# Patient Record
Sex: Male | Born: 1935 | Race: White | Hispanic: No | Marital: Married | Smoking: Never smoker
Health system: Southern US, Community
[De-identification: ages and names within clinical notes are randomized; demographics above are authoritative.]

## PROBLEM LIST (undated history)

## (undated) DIAGNOSIS — I4891 Unspecified atrial fibrillation: Secondary | ICD-10-CM

## (undated) DIAGNOSIS — N183 Chronic kidney disease, stage 3 unspecified: Secondary | ICD-10-CM

## (undated) DIAGNOSIS — E782 Mixed hyperlipidemia: Secondary | ICD-10-CM

## (undated) DIAGNOSIS — I6523 Occlusion and stenosis of bilateral carotid arteries: Secondary | ICD-10-CM

## (undated) DIAGNOSIS — I2510 Atherosclerotic heart disease of native coronary artery without angina pectoris: Secondary | ICD-10-CM

## (undated) DIAGNOSIS — R972 Elevated prostate specific antigen [PSA]: Secondary | ICD-10-CM

## (undated) DIAGNOSIS — N182 Chronic kidney disease, stage 2 (mild): Secondary | ICD-10-CM

## (undated) DIAGNOSIS — R0602 Shortness of breath: Secondary | ICD-10-CM

## (undated) DIAGNOSIS — I779 Disorder of arteries and arterioles, unspecified: Secondary | ICD-10-CM

## (undated) DIAGNOSIS — I509 Heart failure, unspecified: Secondary | ICD-10-CM

## (undated) DIAGNOSIS — I10 Essential (primary) hypertension: Secondary | ICD-10-CM

## (undated) HISTORY — DX: Elevated prostate specific antigen (PSA): R97.20

## (undated) HISTORY — DX: Shortness of breath: R06.02

## (undated) HISTORY — DX: Disorder of arteries and arterioles, unspecified: I77.9

## (undated) HISTORY — DX: Chronic kidney disease, stage 3 unspecified: N18.30

## (undated) HISTORY — DX: Mixed hyperlipidemia: E78.2

## (undated) HISTORY — DX: Chronic kidney disease, stage 3 (moderate): N18.3

## (undated) HISTORY — PX: JOINT REPLACEMENT: SHX530

## (undated) HISTORY — DX: Occlusion and stenosis of bilateral carotid arteries: I65.23

---

## 2005-07-31 ENCOUNTER — Inpatient Hospital Stay: Payer: Self-pay | Admitting: General Practice

## 2005-11-12 ENCOUNTER — Ambulatory Visit: Payer: Self-pay | Admitting: Internal Medicine

## 2005-11-21 ENCOUNTER — Ambulatory Visit: Payer: Self-pay | Admitting: Internal Medicine

## 2006-04-22 ENCOUNTER — Ambulatory Visit: Payer: Self-pay | Admitting: Unknown Physician Specialty

## 2008-07-11 ENCOUNTER — Ambulatory Visit: Payer: Self-pay | Admitting: Ophthalmology

## 2008-07-11 ENCOUNTER — Other Ambulatory Visit: Payer: Self-pay

## 2008-08-07 ENCOUNTER — Ambulatory Visit: Payer: Self-pay | Admitting: Ophthalmology

## 2009-04-19 ENCOUNTER — Ambulatory Visit: Payer: Self-pay | Admitting: Internal Medicine

## 2009-09-11 ENCOUNTER — Ambulatory Visit: Payer: Self-pay | Admitting: Unknown Physician Specialty

## 2011-04-14 ENCOUNTER — Emergency Department: Payer: Self-pay | Admitting: Emergency Medicine

## 2013-11-24 ENCOUNTER — Encounter (INDEPENDENT_AMBULATORY_CARE_PROVIDER_SITE_OTHER): Payer: Medicare Other | Admitting: Ophthalmology

## 2013-11-24 DIAGNOSIS — H43819 Vitreous degeneration, unspecified eye: Secondary | ICD-10-CM

## 2013-11-24 DIAGNOSIS — H35039 Hypertensive retinopathy, unspecified eye: Secondary | ICD-10-CM

## 2013-11-24 DIAGNOSIS — D3130 Benign neoplasm of unspecified choroid: Secondary | ICD-10-CM

## 2013-11-24 DIAGNOSIS — H33309 Unspecified retinal break, unspecified eye: Secondary | ICD-10-CM

## 2013-11-24 DIAGNOSIS — I10 Essential (primary) hypertension: Secondary | ICD-10-CM

## 2013-12-07 ENCOUNTER — Ambulatory Visit (INDEPENDENT_AMBULATORY_CARE_PROVIDER_SITE_OTHER): Payer: Medicare Other | Admitting: Ophthalmology

## 2013-12-08 ENCOUNTER — Ambulatory Visit (INDEPENDENT_AMBULATORY_CARE_PROVIDER_SITE_OTHER): Payer: Medicare Other | Admitting: Ophthalmology

## 2013-12-08 DIAGNOSIS — H33309 Unspecified retinal break, unspecified eye: Secondary | ICD-10-CM

## 2014-04-12 ENCOUNTER — Ambulatory Visit (INDEPENDENT_AMBULATORY_CARE_PROVIDER_SITE_OTHER): Payer: Medicare Other | Admitting: Ophthalmology

## 2014-04-20 ENCOUNTER — Ambulatory Visit (INDEPENDENT_AMBULATORY_CARE_PROVIDER_SITE_OTHER): Payer: Medicare PPO | Admitting: Ophthalmology

## 2014-04-20 DIAGNOSIS — D3130 Benign neoplasm of unspecified choroid: Secondary | ICD-10-CM

## 2014-04-20 DIAGNOSIS — H43819 Vitreous degeneration, unspecified eye: Secondary | ICD-10-CM

## 2014-04-20 DIAGNOSIS — H35039 Hypertensive retinopathy, unspecified eye: Secondary | ICD-10-CM

## 2014-04-20 DIAGNOSIS — I10 Essential (primary) hypertension: Secondary | ICD-10-CM

## 2014-04-20 DIAGNOSIS — H33309 Unspecified retinal break, unspecified eye: Secondary | ICD-10-CM

## 2015-04-24 ENCOUNTER — Ambulatory Visit (INDEPENDENT_AMBULATORY_CARE_PROVIDER_SITE_OTHER): Payer: Commercial Managed Care - HMO | Admitting: Ophthalmology

## 2015-04-24 DIAGNOSIS — H33301 Unspecified retinal break, right eye: Secondary | ICD-10-CM | POA: Diagnosis not present

## 2015-04-24 DIAGNOSIS — D3132 Benign neoplasm of left choroid: Secondary | ICD-10-CM

## 2015-04-24 DIAGNOSIS — I10 Essential (primary) hypertension: Secondary | ICD-10-CM | POA: Diagnosis not present

## 2015-04-24 DIAGNOSIS — H43813 Vitreous degeneration, bilateral: Secondary | ICD-10-CM | POA: Diagnosis not present

## 2015-04-24 DIAGNOSIS — H35033 Hypertensive retinopathy, bilateral: Secondary | ICD-10-CM | POA: Diagnosis not present

## 2016-01-15 DIAGNOSIS — E782 Mixed hyperlipidemia: Secondary | ICD-10-CM | POA: Diagnosis not present

## 2016-01-15 DIAGNOSIS — I6523 Occlusion and stenosis of bilateral carotid arteries: Secondary | ICD-10-CM | POA: Diagnosis not present

## 2016-01-15 DIAGNOSIS — R42 Dizziness and giddiness: Secondary | ICD-10-CM | POA: Diagnosis not present

## 2016-01-15 DIAGNOSIS — I480 Paroxysmal atrial fibrillation: Secondary | ICD-10-CM | POA: Diagnosis not present

## 2016-01-15 DIAGNOSIS — I779 Disorder of arteries and arterioles, unspecified: Secondary | ICD-10-CM | POA: Diagnosis not present

## 2016-01-22 DIAGNOSIS — H401131 Primary open-angle glaucoma, bilateral, mild stage: Secondary | ICD-10-CM | POA: Diagnosis not present

## 2016-01-31 DIAGNOSIS — I6529 Occlusion and stenosis of unspecified carotid artery: Secondary | ICD-10-CM | POA: Diagnosis not present

## 2016-01-31 DIAGNOSIS — I6523 Occlusion and stenosis of bilateral carotid arteries: Secondary | ICD-10-CM | POA: Diagnosis not present

## 2016-01-31 DIAGNOSIS — I739 Peripheral vascular disease, unspecified: Secondary | ICD-10-CM | POA: Diagnosis not present

## 2016-01-31 DIAGNOSIS — I779 Disorder of arteries and arterioles, unspecified: Secondary | ICD-10-CM | POA: Diagnosis not present

## 2016-02-21 DIAGNOSIS — H401131 Primary open-angle glaucoma, bilateral, mild stage: Secondary | ICD-10-CM | POA: Diagnosis not present

## 2016-03-04 DIAGNOSIS — Z85828 Personal history of other malignant neoplasm of skin: Secondary | ICD-10-CM | POA: Diagnosis not present

## 2016-03-04 DIAGNOSIS — L570 Actinic keratosis: Secondary | ICD-10-CM | POA: Diagnosis not present

## 2016-03-04 DIAGNOSIS — Z08 Encounter for follow-up examination after completed treatment for malignant neoplasm: Secondary | ICD-10-CM | POA: Diagnosis not present

## 2016-03-04 DIAGNOSIS — D485 Neoplasm of uncertain behavior of skin: Secondary | ICD-10-CM | POA: Diagnosis not present

## 2016-03-04 DIAGNOSIS — C44619 Basal cell carcinoma of skin of left upper limb, including shoulder: Secondary | ICD-10-CM | POA: Diagnosis not present

## 2016-03-04 DIAGNOSIS — X32XXXA Exposure to sunlight, initial encounter: Secondary | ICD-10-CM | POA: Diagnosis not present

## 2016-04-10 DIAGNOSIS — C44619 Basal cell carcinoma of skin of left upper limb, including shoulder: Secondary | ICD-10-CM | POA: Diagnosis not present

## 2016-04-10 DIAGNOSIS — X32XXXA Exposure to sunlight, initial encounter: Secondary | ICD-10-CM | POA: Diagnosis not present

## 2016-04-10 DIAGNOSIS — L570 Actinic keratosis: Secondary | ICD-10-CM | POA: Diagnosis not present

## 2016-04-24 ENCOUNTER — Ambulatory Visit (INDEPENDENT_AMBULATORY_CARE_PROVIDER_SITE_OTHER): Payer: PPO | Admitting: Ophthalmology

## 2016-04-24 DIAGNOSIS — H35033 Hypertensive retinopathy, bilateral: Secondary | ICD-10-CM

## 2016-04-24 DIAGNOSIS — D3132 Benign neoplasm of left choroid: Secondary | ICD-10-CM | POA: Diagnosis not present

## 2016-04-24 DIAGNOSIS — I10 Essential (primary) hypertension: Secondary | ICD-10-CM

## 2016-04-24 DIAGNOSIS — H33301 Unspecified retinal break, right eye: Secondary | ICD-10-CM

## 2016-04-24 DIAGNOSIS — H43813 Vitreous degeneration, bilateral: Secondary | ICD-10-CM

## 2016-05-13 DIAGNOSIS — I480 Paroxysmal atrial fibrillation: Secondary | ICD-10-CM | POA: Diagnosis not present

## 2016-05-13 DIAGNOSIS — I779 Disorder of arteries and arterioles, unspecified: Secondary | ICD-10-CM | POA: Diagnosis not present

## 2016-05-13 DIAGNOSIS — R42 Dizziness and giddiness: Secondary | ICD-10-CM | POA: Diagnosis not present

## 2016-05-13 DIAGNOSIS — I6523 Occlusion and stenosis of bilateral carotid arteries: Secondary | ICD-10-CM | POA: Diagnosis not present

## 2016-05-13 DIAGNOSIS — I2510 Atherosclerotic heart disease of native coronary artery without angina pectoris: Secondary | ICD-10-CM | POA: Diagnosis not present

## 2016-05-13 DIAGNOSIS — I10 Essential (primary) hypertension: Secondary | ICD-10-CM | POA: Diagnosis not present

## 2016-05-13 DIAGNOSIS — E782 Mixed hyperlipidemia: Secondary | ICD-10-CM | POA: Diagnosis not present

## 2016-05-21 DIAGNOSIS — I10 Essential (primary) hypertension: Secondary | ICD-10-CM | POA: Diagnosis not present

## 2016-05-21 DIAGNOSIS — R42 Dizziness and giddiness: Secondary | ICD-10-CM | POA: Diagnosis not present

## 2016-05-21 DIAGNOSIS — E782 Mixed hyperlipidemia: Secondary | ICD-10-CM | POA: Diagnosis not present

## 2016-05-21 DIAGNOSIS — I2510 Atherosclerotic heart disease of native coronary artery without angina pectoris: Secondary | ICD-10-CM | POA: Diagnosis not present

## 2016-05-21 DIAGNOSIS — I6523 Occlusion and stenosis of bilateral carotid arteries: Secondary | ICD-10-CM | POA: Diagnosis not present

## 2016-05-21 DIAGNOSIS — I480 Paroxysmal atrial fibrillation: Secondary | ICD-10-CM | POA: Diagnosis not present

## 2016-05-21 DIAGNOSIS — I779 Disorder of arteries and arterioles, unspecified: Secondary | ICD-10-CM | POA: Diagnosis not present

## 2016-05-22 DIAGNOSIS — I480 Paroxysmal atrial fibrillation: Secondary | ICD-10-CM | POA: Diagnosis not present

## 2016-06-23 DIAGNOSIS — Z125 Encounter for screening for malignant neoplasm of prostate: Secondary | ICD-10-CM | POA: Diagnosis not present

## 2016-06-23 DIAGNOSIS — E7800 Pure hypercholesterolemia, unspecified: Secondary | ICD-10-CM | POA: Diagnosis not present

## 2016-06-23 DIAGNOSIS — Z79899 Other long term (current) drug therapy: Secondary | ICD-10-CM | POA: Diagnosis not present

## 2016-07-01 DIAGNOSIS — Z79899 Other long term (current) drug therapy: Secondary | ICD-10-CM | POA: Diagnosis not present

## 2016-07-01 DIAGNOSIS — Z125 Encounter for screening for malignant neoplasm of prostate: Secondary | ICD-10-CM | POA: Diagnosis not present

## 2016-07-01 DIAGNOSIS — I10 Essential (primary) hypertension: Secondary | ICD-10-CM | POA: Diagnosis not present

## 2016-07-01 DIAGNOSIS — E782 Mixed hyperlipidemia: Secondary | ICD-10-CM | POA: Diagnosis not present

## 2016-07-01 DIAGNOSIS — R42 Dizziness and giddiness: Secondary | ICD-10-CM | POA: Diagnosis not present

## 2016-08-12 DIAGNOSIS — Z85828 Personal history of other malignant neoplasm of skin: Secondary | ICD-10-CM | POA: Diagnosis not present

## 2016-08-12 DIAGNOSIS — L570 Actinic keratosis: Secondary | ICD-10-CM | POA: Diagnosis not present

## 2016-08-12 DIAGNOSIS — Z08 Encounter for follow-up examination after completed treatment for malignant neoplasm: Secondary | ICD-10-CM | POA: Diagnosis not present

## 2016-08-12 DIAGNOSIS — D225 Melanocytic nevi of trunk: Secondary | ICD-10-CM | POA: Diagnosis not present

## 2016-08-12 DIAGNOSIS — L821 Other seborrheic keratosis: Secondary | ICD-10-CM | POA: Diagnosis not present

## 2016-08-12 DIAGNOSIS — X32XXXA Exposure to sunlight, initial encounter: Secondary | ICD-10-CM | POA: Diagnosis not present

## 2016-08-19 DIAGNOSIS — H401131 Primary open-angle glaucoma, bilateral, mild stage: Secondary | ICD-10-CM | POA: Diagnosis not present

## 2016-08-28 DIAGNOSIS — H401131 Primary open-angle glaucoma, bilateral, mild stage: Secondary | ICD-10-CM | POA: Diagnosis not present

## 2016-11-18 DIAGNOSIS — I2510 Atherosclerotic heart disease of native coronary artery without angina pectoris: Secondary | ICD-10-CM | POA: Diagnosis not present

## 2016-11-18 DIAGNOSIS — I480 Paroxysmal atrial fibrillation: Secondary | ICD-10-CM | POA: Diagnosis not present

## 2016-11-18 DIAGNOSIS — R42 Dizziness and giddiness: Secondary | ICD-10-CM | POA: Diagnosis not present

## 2016-11-18 DIAGNOSIS — E782 Mixed hyperlipidemia: Secondary | ICD-10-CM | POA: Diagnosis not present

## 2016-12-10 DIAGNOSIS — I480 Paroxysmal atrial fibrillation: Secondary | ICD-10-CM | POA: Diagnosis not present

## 2016-12-10 DIAGNOSIS — I2510 Atherosclerotic heart disease of native coronary artery without angina pectoris: Secondary | ICD-10-CM | POA: Diagnosis not present

## 2016-12-10 DIAGNOSIS — R0602 Shortness of breath: Secondary | ICD-10-CM | POA: Diagnosis not present

## 2016-12-10 DIAGNOSIS — E782 Mixed hyperlipidemia: Secondary | ICD-10-CM | POA: Diagnosis not present

## 2016-12-10 DIAGNOSIS — I10 Essential (primary) hypertension: Secondary | ICD-10-CM | POA: Diagnosis not present

## 2016-12-26 DIAGNOSIS — Z79899 Other long term (current) drug therapy: Secondary | ICD-10-CM | POA: Diagnosis not present

## 2016-12-26 DIAGNOSIS — Z125 Encounter for screening for malignant neoplasm of prostate: Secondary | ICD-10-CM | POA: Diagnosis not present

## 2016-12-26 DIAGNOSIS — E782 Mixed hyperlipidemia: Secondary | ICD-10-CM | POA: Diagnosis not present

## 2016-12-26 DIAGNOSIS — I10 Essential (primary) hypertension: Secondary | ICD-10-CM | POA: Diagnosis not present

## 2017-01-01 DIAGNOSIS — N138 Other obstructive and reflux uropathy: Secondary | ICD-10-CM | POA: Diagnosis not present

## 2017-01-01 DIAGNOSIS — G629 Polyneuropathy, unspecified: Secondary | ICD-10-CM | POA: Diagnosis not present

## 2017-01-01 DIAGNOSIS — R972 Elevated prostate specific antigen [PSA]: Secondary | ICD-10-CM | POA: Diagnosis not present

## 2017-01-01 DIAGNOSIS — I2510 Atherosclerotic heart disease of native coronary artery without angina pectoris: Secondary | ICD-10-CM | POA: Diagnosis not present

## 2017-01-01 DIAGNOSIS — Z0000 Encounter for general adult medical examination without abnormal findings: Secondary | ICD-10-CM | POA: Diagnosis not present

## 2017-01-01 DIAGNOSIS — I10 Essential (primary) hypertension: Secondary | ICD-10-CM | POA: Diagnosis not present

## 2017-01-01 DIAGNOSIS — E782 Mixed hyperlipidemia: Secondary | ICD-10-CM | POA: Diagnosis not present

## 2017-01-01 DIAGNOSIS — Z79899 Other long term (current) drug therapy: Secondary | ICD-10-CM | POA: Diagnosis not present

## 2017-01-01 DIAGNOSIS — N401 Enlarged prostate with lower urinary tract symptoms: Secondary | ICD-10-CM | POA: Diagnosis not present

## 2017-01-01 DIAGNOSIS — R7309 Other abnormal glucose: Secondary | ICD-10-CM | POA: Diagnosis not present

## 2017-02-24 DIAGNOSIS — M189 Osteoarthritis of first carpometacarpal joint, unspecified: Secondary | ICD-10-CM | POA: Diagnosis not present

## 2017-02-24 DIAGNOSIS — H401131 Primary open-angle glaucoma, bilateral, mild stage: Secondary | ICD-10-CM | POA: Diagnosis not present

## 2017-02-24 DIAGNOSIS — S66912A Strain of unspecified muscle, fascia and tendon at wrist and hand level, left hand, initial encounter: Secondary | ICD-10-CM | POA: Diagnosis not present

## 2017-03-23 DIAGNOSIS — Y92009 Unspecified place in unspecified non-institutional (private) residence as the place of occurrence of the external cause: Secondary | ICD-10-CM | POA: Diagnosis not present

## 2017-03-23 DIAGNOSIS — I779 Disorder of arteries and arterioles, unspecified: Secondary | ICD-10-CM | POA: Diagnosis not present

## 2017-03-23 DIAGNOSIS — W19XXXS Unspecified fall, sequela: Secondary | ICD-10-CM | POA: Diagnosis not present

## 2017-03-23 DIAGNOSIS — I480 Paroxysmal atrial fibrillation: Secondary | ICD-10-CM | POA: Diagnosis not present

## 2017-03-23 DIAGNOSIS — I2510 Atherosclerotic heart disease of native coronary artery without angina pectoris: Secondary | ICD-10-CM | POA: Diagnosis not present

## 2017-03-26 DIAGNOSIS — H8110 Benign paroxysmal vertigo, unspecified ear: Secondary | ICD-10-CM | POA: Diagnosis not present

## 2017-04-14 DIAGNOSIS — L570 Actinic keratosis: Secondary | ICD-10-CM | POA: Diagnosis not present

## 2017-04-14 DIAGNOSIS — D2261 Melanocytic nevi of right upper limb, including shoulder: Secondary | ICD-10-CM | POA: Diagnosis not present

## 2017-04-14 DIAGNOSIS — D225 Melanocytic nevi of trunk: Secondary | ICD-10-CM | POA: Diagnosis not present

## 2017-04-14 DIAGNOSIS — Z85828 Personal history of other malignant neoplasm of skin: Secondary | ICD-10-CM | POA: Diagnosis not present

## 2017-04-14 DIAGNOSIS — C44319 Basal cell carcinoma of skin of other parts of face: Secondary | ICD-10-CM | POA: Diagnosis not present

## 2017-04-14 DIAGNOSIS — D2272 Melanocytic nevi of left lower limb, including hip: Secondary | ICD-10-CM | POA: Diagnosis not present

## 2017-04-14 DIAGNOSIS — D485 Neoplasm of uncertain behavior of skin: Secondary | ICD-10-CM | POA: Diagnosis not present

## 2017-04-14 DIAGNOSIS — X32XXXA Exposure to sunlight, initial encounter: Secondary | ICD-10-CM | POA: Diagnosis not present

## 2017-04-21 DIAGNOSIS — L905 Scar conditions and fibrosis of skin: Secondary | ICD-10-CM | POA: Diagnosis not present

## 2017-04-21 DIAGNOSIS — C44319 Basal cell carcinoma of skin of other parts of face: Secondary | ICD-10-CM | POA: Diagnosis not present

## 2017-04-28 ENCOUNTER — Ambulatory Visit (INDEPENDENT_AMBULATORY_CARE_PROVIDER_SITE_OTHER): Payer: PPO | Admitting: Ophthalmology

## 2017-04-28 DIAGNOSIS — I10 Essential (primary) hypertension: Secondary | ICD-10-CM | POA: Diagnosis not present

## 2017-04-28 DIAGNOSIS — H33301 Unspecified retinal break, right eye: Secondary | ICD-10-CM

## 2017-04-28 DIAGNOSIS — H43813 Vitreous degeneration, bilateral: Secondary | ICD-10-CM | POA: Diagnosis not present

## 2017-04-28 DIAGNOSIS — D3132 Benign neoplasm of left choroid: Secondary | ICD-10-CM

## 2017-04-28 DIAGNOSIS — H35033 Hypertensive retinopathy, bilateral: Secondary | ICD-10-CM

## 2017-05-26 DIAGNOSIS — M7541 Impingement syndrome of right shoulder: Secondary | ICD-10-CM | POA: Diagnosis not present

## 2017-05-26 DIAGNOSIS — S46011A Strain of muscle(s) and tendon(s) of the rotator cuff of right shoulder, initial encounter: Secondary | ICD-10-CM | POA: Diagnosis not present

## 2017-06-25 DIAGNOSIS — R7309 Other abnormal glucose: Secondary | ICD-10-CM | POA: Diagnosis not present

## 2017-06-25 DIAGNOSIS — R972 Elevated prostate specific antigen [PSA]: Secondary | ICD-10-CM | POA: Diagnosis not present

## 2017-06-25 DIAGNOSIS — E782 Mixed hyperlipidemia: Secondary | ICD-10-CM | POA: Diagnosis not present

## 2017-06-25 DIAGNOSIS — I10 Essential (primary) hypertension: Secondary | ICD-10-CM | POA: Diagnosis not present

## 2017-06-25 DIAGNOSIS — Z79899 Other long term (current) drug therapy: Secondary | ICD-10-CM | POA: Diagnosis not present

## 2017-07-02 DIAGNOSIS — I10 Essential (primary) hypertension: Secondary | ICD-10-CM | POA: Diagnosis not present

## 2017-07-02 DIAGNOSIS — I480 Paroxysmal atrial fibrillation: Secondary | ICD-10-CM | POA: Diagnosis not present

## 2017-07-02 DIAGNOSIS — E782 Mixed hyperlipidemia: Secondary | ICD-10-CM | POA: Diagnosis not present

## 2017-07-02 DIAGNOSIS — Z79899 Other long term (current) drug therapy: Secondary | ICD-10-CM | POA: Diagnosis not present

## 2017-07-02 DIAGNOSIS — Z0000 Encounter for general adult medical examination without abnormal findings: Secondary | ICD-10-CM | POA: Diagnosis not present

## 2017-07-02 DIAGNOSIS — R7309 Other abnormal glucose: Secondary | ICD-10-CM | POA: Diagnosis not present

## 2017-08-25 DIAGNOSIS — H401131 Primary open-angle glaucoma, bilateral, mild stage: Secondary | ICD-10-CM | POA: Diagnosis not present

## 2017-08-27 DIAGNOSIS — D225 Melanocytic nevi of trunk: Secondary | ICD-10-CM | POA: Diagnosis not present

## 2017-08-27 DIAGNOSIS — D485 Neoplasm of uncertain behavior of skin: Secondary | ICD-10-CM | POA: Diagnosis not present

## 2017-08-27 DIAGNOSIS — L905 Scar conditions and fibrosis of skin: Secondary | ICD-10-CM | POA: Diagnosis not present

## 2017-08-27 DIAGNOSIS — Z85828 Personal history of other malignant neoplasm of skin: Secondary | ICD-10-CM | POA: Diagnosis not present

## 2017-08-27 DIAGNOSIS — X32XXXA Exposure to sunlight, initial encounter: Secondary | ICD-10-CM | POA: Diagnosis not present

## 2017-08-27 DIAGNOSIS — D044 Carcinoma in situ of skin of scalp and neck: Secondary | ICD-10-CM | POA: Diagnosis not present

## 2017-08-27 DIAGNOSIS — L821 Other seborrheic keratosis: Secondary | ICD-10-CM | POA: Diagnosis not present

## 2017-08-27 DIAGNOSIS — L570 Actinic keratosis: Secondary | ICD-10-CM | POA: Diagnosis not present

## 2017-08-27 DIAGNOSIS — L817 Pigmented purpuric dermatosis: Secondary | ICD-10-CM | POA: Diagnosis not present

## 2017-08-28 DIAGNOSIS — I10 Essential (primary) hypertension: Secondary | ICD-10-CM | POA: Diagnosis not present

## 2017-08-28 DIAGNOSIS — I480 Paroxysmal atrial fibrillation: Secondary | ICD-10-CM | POA: Diagnosis not present

## 2017-08-28 DIAGNOSIS — Z79899 Other long term (current) drug therapy: Secondary | ICD-10-CM | POA: Diagnosis not present

## 2017-08-28 DIAGNOSIS — R0602 Shortness of breath: Secondary | ICD-10-CM | POA: Diagnosis not present

## 2017-09-01 DIAGNOSIS — H401131 Primary open-angle glaucoma, bilateral, mild stage: Secondary | ICD-10-CM | POA: Diagnosis not present

## 2017-09-07 DIAGNOSIS — D044 Carcinoma in situ of skin of scalp and neck: Secondary | ICD-10-CM | POA: Diagnosis not present

## 2017-09-22 DIAGNOSIS — E782 Mixed hyperlipidemia: Secondary | ICD-10-CM | POA: Diagnosis not present

## 2017-09-22 DIAGNOSIS — I480 Paroxysmal atrial fibrillation: Secondary | ICD-10-CM | POA: Diagnosis not present

## 2017-09-22 DIAGNOSIS — I2510 Atherosclerotic heart disease of native coronary artery without angina pectoris: Secondary | ICD-10-CM | POA: Diagnosis not present

## 2017-09-22 DIAGNOSIS — R001 Bradycardia, unspecified: Secondary | ICD-10-CM | POA: Diagnosis not present

## 2017-09-24 DIAGNOSIS — M7541 Impingement syndrome of right shoulder: Secondary | ICD-10-CM | POA: Diagnosis not present

## 2017-09-24 DIAGNOSIS — M25532 Pain in left wrist: Secondary | ICD-10-CM | POA: Diagnosis not present

## 2017-09-24 DIAGNOSIS — G5602 Carpal tunnel syndrome, left upper limb: Secondary | ICD-10-CM | POA: Diagnosis not present

## 2017-09-24 DIAGNOSIS — M25511 Pain in right shoulder: Secondary | ICD-10-CM | POA: Diagnosis not present

## 2017-09-28 DIAGNOSIS — I480 Paroxysmal atrial fibrillation: Secondary | ICD-10-CM | POA: Diagnosis not present

## 2017-09-28 DIAGNOSIS — R001 Bradycardia, unspecified: Secondary | ICD-10-CM | POA: Diagnosis not present

## 2017-10-13 DIAGNOSIS — R001 Bradycardia, unspecified: Secondary | ICD-10-CM | POA: Diagnosis not present

## 2017-10-13 DIAGNOSIS — R0602 Shortness of breath: Secondary | ICD-10-CM | POA: Diagnosis not present

## 2017-10-13 DIAGNOSIS — I480 Paroxysmal atrial fibrillation: Secondary | ICD-10-CM | POA: Diagnosis not present

## 2017-10-13 DIAGNOSIS — I5021 Acute systolic (congestive) heart failure: Secondary | ICD-10-CM | POA: Diagnosis not present

## 2017-10-13 DIAGNOSIS — I2510 Atherosclerotic heart disease of native coronary artery without angina pectoris: Secondary | ICD-10-CM | POA: Diagnosis not present

## 2017-10-13 DIAGNOSIS — I25118 Atherosclerotic heart disease of native coronary artery with other forms of angina pectoris: Secondary | ICD-10-CM | POA: Diagnosis not present

## 2017-10-14 ENCOUNTER — Inpatient Hospital Stay
Admission: EM | Admit: 2017-10-14 | Discharge: 2017-10-18 | DRG: 308 | Disposition: A | Discharge status: Home / Self Care | Payer: PPO | Attending: Internal Medicine | Admitting: Internal Medicine

## 2017-10-14 ENCOUNTER — Encounter: Payer: Self-pay | Admitting: Emergency Medicine

## 2017-10-14 ENCOUNTER — Emergency Department: Payer: PPO

## 2017-10-14 DIAGNOSIS — R739 Hyperglycemia, unspecified: Secondary | ICD-10-CM | POA: Diagnosis not present

## 2017-10-14 DIAGNOSIS — Z7901 Long term (current) use of anticoagulants: Secondary | ICD-10-CM | POA: Diagnosis not present

## 2017-10-14 DIAGNOSIS — Z79899 Other long term (current) drug therapy: Secondary | ICD-10-CM

## 2017-10-14 DIAGNOSIS — I10 Essential (primary) hypertension: Secondary | ICD-10-CM | POA: Diagnosis not present

## 2017-10-14 DIAGNOSIS — I482 Chronic atrial fibrillation: Secondary | ICD-10-CM | POA: Diagnosis not present

## 2017-10-14 DIAGNOSIS — H409 Unspecified glaucoma: Secondary | ICD-10-CM | POA: Diagnosis not present

## 2017-10-14 DIAGNOSIS — Z8249 Family history of ischemic heart disease and other diseases of the circulatory system: Secondary | ICD-10-CM | POA: Diagnosis not present

## 2017-10-14 DIAGNOSIS — N183 Chronic kidney disease, stage 3 (moderate): Secondary | ICD-10-CM | POA: Diagnosis not present

## 2017-10-14 DIAGNOSIS — I5023 Acute on chronic systolic (congestive) heart failure: Secondary | ICD-10-CM | POA: Diagnosis present

## 2017-10-14 DIAGNOSIS — N179 Acute kidney failure, unspecified: Secondary | ICD-10-CM | POA: Diagnosis not present

## 2017-10-14 DIAGNOSIS — Z7951 Long term (current) use of inhaled steroids: Secondary | ICD-10-CM

## 2017-10-14 DIAGNOSIS — I429 Cardiomyopathy, unspecified: Secondary | ICD-10-CM | POA: Diagnosis not present

## 2017-10-14 DIAGNOSIS — R062 Wheezing: Secondary | ICD-10-CM

## 2017-10-14 DIAGNOSIS — I4891 Unspecified atrial fibrillation: Secondary | ICD-10-CM | POA: Diagnosis not present

## 2017-10-14 DIAGNOSIS — I5021 Acute systolic (congestive) heart failure: Secondary | ICD-10-CM | POA: Diagnosis not present

## 2017-10-14 DIAGNOSIS — G629 Polyneuropathy, unspecified: Secondary | ICD-10-CM | POA: Diagnosis present

## 2017-10-14 DIAGNOSIS — I130 Hypertensive heart and chronic kidney disease with heart failure and stage 1 through stage 4 chronic kidney disease, or unspecified chronic kidney disease: Secondary | ICD-10-CM | POA: Diagnosis not present

## 2017-10-14 DIAGNOSIS — N400 Enlarged prostate without lower urinary tract symptoms: Secondary | ICD-10-CM | POA: Diagnosis present

## 2017-10-14 DIAGNOSIS — I480 Paroxysmal atrial fibrillation: Secondary | ICD-10-CM | POA: Diagnosis not present

## 2017-10-14 DIAGNOSIS — Z66 Do not resuscitate: Secondary | ICD-10-CM | POA: Diagnosis not present

## 2017-10-14 DIAGNOSIS — I509 Heart failure, unspecified: Secondary | ICD-10-CM

## 2017-10-14 DIAGNOSIS — R0602 Shortness of breath: Secondary | ICD-10-CM | POA: Diagnosis not present

## 2017-10-14 DIAGNOSIS — E785 Hyperlipidemia, unspecified: Secondary | ICD-10-CM | POA: Diagnosis present

## 2017-10-14 DIAGNOSIS — I2510 Atherosclerotic heart disease of native coronary artery without angina pectoris: Secondary | ICD-10-CM | POA: Diagnosis present

## 2017-10-14 HISTORY — DX: Essential (primary) hypertension: I10

## 2017-10-14 HISTORY — DX: Unspecified atrial fibrillation: I48.91

## 2017-10-14 HISTORY — DX: Heart failure, unspecified: I50.9

## 2017-10-14 HISTORY — DX: Atherosclerotic heart disease of native coronary artery without angina pectoris: I25.10

## 2017-10-14 LAB — CBC WITH DIFFERENTIAL/PLATELET
Basophils Absolute: 0.1 10*3/uL (ref 0–0.1)
Basophils Relative: 1 %
EOS PCT: 1 %
Eosinophils Absolute: 0.1 10*3/uL (ref 0–0.7)
HCT: 44.7 % (ref 40.0–52.0)
Hemoglobin: 14.9 g/dL (ref 13.0–18.0)
LYMPHS ABS: 1.9 10*3/uL (ref 1.0–3.6)
LYMPHS PCT: 20 %
MCH: 33.1 pg (ref 26.0–34.0)
MCHC: 33.4 g/dL (ref 32.0–36.0)
MCV: 99.2 fL (ref 80.0–100.0)
Monocytes Absolute: 0.8 10*3/uL (ref 0.2–1.0)
Monocytes Relative: 9 %
NEUTROS ABS: 6.5 10*3/uL (ref 1.4–6.5)
Neutrophils Relative %: 69 %
PLATELETS: 184 10*3/uL (ref 150–440)
RBC: 4.5 MIL/uL (ref 4.40–5.90)
RDW: 14.3 % (ref 11.5–14.5)
WBC: 9.4 10*3/uL (ref 3.8–10.6)

## 2017-10-14 LAB — URINALYSIS, COMPLETE (UACMP) WITH MICROSCOPIC
Bilirubin Urine: NEGATIVE
Glucose, UA: NEGATIVE mg/dL
KETONES UR: NEGATIVE mg/dL
Leukocytes, UA: NEGATIVE
NITRITE: NEGATIVE
PROTEIN: NEGATIVE mg/dL
SPECIFIC GRAVITY, URINE: 1.005 (ref 1.005–1.030)
Squamous Epithelial / LPF: NONE SEEN
pH: 7 (ref 5.0–8.0)

## 2017-10-14 LAB — TSH: TSH: 3.573 u[IU]/mL (ref 0.350–4.500)

## 2017-10-14 LAB — COMPREHENSIVE METABOLIC PANEL
ALK PHOS: 99 U/L (ref 38–126)
ALT: 78 U/L — ABNORMAL HIGH (ref 17–63)
AST: 55 U/L — ABNORMAL HIGH (ref 15–41)
Albumin: 4 g/dL (ref 3.5–5.0)
Anion gap: 12 (ref 5–15)
BUN: 30 mg/dL — ABNORMAL HIGH (ref 6–20)
CHLORIDE: 99 mmol/L — AB (ref 101–111)
CO2: 23 mmol/L (ref 22–32)
Calcium: 9.3 mg/dL (ref 8.9–10.3)
Creatinine, Ser: 1.42 mg/dL — ABNORMAL HIGH (ref 0.61–1.24)
GFR calc non Af Amer: 43 mL/min — ABNORMAL LOW (ref >60)
GFR, EST AFRICAN AMERICAN: 49 mL/min — AB (ref >60)
Glucose, Bld: 183 mg/dL — ABNORMAL HIGH (ref 65–99)
Potassium: 4 mmol/L (ref 3.5–5.1)
Sodium: 134 mmol/L — ABNORMAL LOW (ref 135–145)
Total Bilirubin: 1.1 mg/dL (ref 0.3–1.2)
Total Protein: 6.9 g/dL (ref 6.5–8.1)

## 2017-10-14 LAB — TROPONIN I
TROPONIN I: 0.03 ng/mL — AB (ref <0.03)
TROPONIN I: 0.03 ng/mL — AB (ref <0.03)
Troponin I: <0.03 ng/mL (ref <0.03)

## 2017-10-14 LAB — BRAIN NATRIURETIC PEPTIDE: B Natriuretic Peptide: 1134 pg/mL — ABNORMAL HIGH (ref 0.0–100.0)

## 2017-10-14 MED ORDER — ADULT MULTIVITAMIN W/MINERALS CH
1.0000 | ORAL_TABLET | Freq: Every day | ORAL | Status: DC
  Administered 2017-10-15 – 2017-10-18 (×4): 1 via ORAL
  Filled 2017-10-14 (×4): qty 1

## 2017-10-14 MED ORDER — GABAPENTIN 300 MG PO CAPS
300.0000 mg | ORAL_CAPSULE | Freq: Three times a day (TID) | ORAL | Status: DC
  Administered 2017-10-14 – 2017-10-18 (×11): 300 mg via ORAL
  Filled 2017-10-14 (×12): qty 1

## 2017-10-14 MED ORDER — SODIUM CHLORIDE 0.9% FLUSH
3.0000 mL | Freq: Two times a day (BID) | INTRAVENOUS | Status: DC
  Administered 2017-10-14 – 2017-10-17 (×7): 3 mL via INTRAVENOUS

## 2017-10-14 MED ORDER — ATORVASTATIN CALCIUM 20 MG PO TABS
80.0000 mg | ORAL_TABLET | Freq: Every day | ORAL | Status: DC
  Administered 2017-10-15 – 2017-10-18 (×4): 80 mg via ORAL
  Filled 2017-10-14 (×4): qty 4

## 2017-10-14 MED ORDER — DOCUSATE SODIUM 100 MG PO CAPS
100.0000 mg | ORAL_CAPSULE | Freq: Two times a day (BID) | ORAL | Status: DC | PRN
  Administered 2017-10-17: 100 mg via ORAL
  Filled 2017-10-14: qty 1

## 2017-10-14 MED ORDER — FUROSEMIDE 10 MG/ML IJ SOLN
20.0000 mg | Freq: Three times a day (TID) | INTRAMUSCULAR | Status: DC
  Administered 2017-10-14 – 2017-10-16 (×5): 20 mg via INTRAVENOUS
  Filled 2017-10-14 (×6): qty 2

## 2017-10-14 MED ORDER — VITAMIN B-12 1000 MCG PO TABS
1000.0000 ug | ORAL_TABLET | Freq: Every day | ORAL | Status: DC
  Administered 2017-10-15 – 2017-10-18 (×4): 1000 ug via ORAL
  Filled 2017-10-14 (×4): qty 1

## 2017-10-14 MED ORDER — TRAMADOL HCL 50 MG PO TABS
50.0000 mg | ORAL_TABLET | Freq: Four times a day (QID) | ORAL | Status: DC | PRN
Start: 2017-10-14 — End: 2017-10-18

## 2017-10-14 MED ORDER — APIXABAN 5 MG PO TABS
5.0000 mg | ORAL_TABLET | Freq: Two times a day (BID) | ORAL | Status: DC
  Administered 2017-10-14 – 2017-10-16 (×4): 5 mg via ORAL
  Filled 2017-10-14 (×4): qty 1

## 2017-10-14 MED ORDER — ONDANSETRON HCL 4 MG/2ML IJ SOLN
INTRAMUSCULAR | Status: AC
  Administered 2017-10-14: 4 mg
  Filled 2017-10-14: qty 2

## 2017-10-14 MED ORDER — FUROSEMIDE 10 MG/ML IJ SOLN
60.0000 mg | Freq: Once | INTRAMUSCULAR | Status: AC
  Administered 2017-10-14: 60 mg via INTRAVENOUS
  Filled 2017-10-14: qty 8

## 2017-10-14 MED ORDER — POTASSIUM CHLORIDE CRYS ER 20 MEQ PO TBCR
20.0000 meq | EXTENDED_RELEASE_TABLET | Freq: Two times a day (BID) | ORAL | Status: DC
  Administered 2017-10-14 – 2017-10-18 (×8): 20 meq via ORAL
  Filled 2017-10-14 (×8): qty 1

## 2017-10-14 MED ORDER — AMIODARONE HCL IN DEXTROSE 360-4.14 MG/200ML-% IV SOLN
60.0000 mg/h | INTRAVENOUS | Status: DC
  Administered 2017-10-14 (×2): 60 mg/h via INTRAVENOUS
  Filled 2017-10-14: qty 200

## 2017-10-14 MED ORDER — DILTIAZEM HCL ER COATED BEADS 120 MG PO CP24
120.0000 mg | ORAL_CAPSULE | Freq: Every day | ORAL | Status: DC
  Administered 2017-10-15 – 2017-10-17 (×3): 120 mg via ORAL
  Filled 2017-10-14 (×2): qty 1

## 2017-10-14 MED ORDER — TAMSULOSIN HCL 0.4 MG PO CAPS
0.4000 mg | ORAL_CAPSULE | Freq: Two times a day (BID) | ORAL | Status: DC
  Administered 2017-10-14 – 2017-10-18 (×8): 0.4 mg via ORAL
  Filled 2017-10-14 (×8): qty 1

## 2017-10-14 MED ORDER — ONDANSETRON HCL 4 MG/2ML IJ SOLN
4.0000 mg | Freq: Four times a day (QID) | INTRAMUSCULAR | Status: DC | PRN
  Administered 2017-10-15: 4 mg via INTRAVENOUS
  Filled 2017-10-14: qty 2

## 2017-10-14 MED ORDER — AMIODARONE HCL IN DEXTROSE 360-4.14 MG/200ML-% IV SOLN
30.0000 mg/h | INTRAVENOUS | Status: DC
  Administered 2017-10-15: 30 mg/h via INTRAVENOUS
  Filled 2017-10-14: qty 200

## 2017-10-14 MED ORDER — PANTOPRAZOLE SODIUM 40 MG PO TBEC
40.0000 mg | DELAYED_RELEASE_TABLET | Freq: Every day | ORAL | Status: DC
  Administered 2017-10-15 – 2017-10-18 (×4): 40 mg via ORAL
  Filled 2017-10-14 (×4): qty 1

## 2017-10-14 MED ORDER — FLUTICASONE PROPIONATE 50 MCG/ACT NA SUSP
2.0000 | Freq: Every day | NASAL | Status: DC
  Administered 2017-10-15 – 2017-10-17 (×3): 2 via NASAL
  Filled 2017-10-14: qty 16

## 2017-10-14 MED ORDER — ONDANSETRON HCL 4 MG/2ML IJ SOLN: 4.0000 mg | Freq: Four times a day (QID) | INTRAMUSCULAR | Status: DC

## 2017-10-14 MED ORDER — MECLIZINE HCL 25 MG PO TABS
25.0000 mg | ORAL_TABLET | ORAL | Status: DC | PRN
  Filled 2017-10-14: qty 1

## 2017-10-14 MED ORDER — TIMOLOL HEMIHYDRATE 0.5 % OP SOLN
1.0000 [drp] | Freq: Every day | OPHTHALMIC | Status: DC
  Administered 2017-10-15 – 2017-10-17 (×3): 1 [drp] via OPHTHALMIC
  Filled 2017-10-14: qty 5

## 2017-10-14 MED ORDER — AMIODARONE LOAD VIA INFUSION
150.0000 mg | Freq: Once | INTRAVENOUS | Status: AC
  Administered 2017-10-14: 150 mg via INTRAVENOUS
  Filled 2017-10-14: qty 83.34

## 2017-10-14 NOTE — Consult Note (Signed)
[facility] Cardiology  CARDIOLOGY CONSULT NOTE  Patient ID: BURGESS SHERIFF MRN: [id number] DOB/AGE: 81-Dec-1928 81 y.o.  Admit date: 10/14/2017 Referring Physician Anselm Jungling Primary Physician [facility] Primary Cardiologist Nehemiah Massed Reason for Consultation Atrial fibrillation with RVR  HPI: 83 year old male referred for evaluation of atrial fibrillation with rapid ventricular rate.  The patient has a history of atrial fibrillation, chronic systolic CHF, cardiomyopathy with an EF of 15%, coronary artery disease based on previous functional studies, and essential hypertension.  The patient reports a 2 3-week history of progressive peripheral edema and shortness of breath, as well as palpitations.  The patient was seen by his cardiologist yesterday for these symptoms.  His heart rate at the time was 141 bpm.  Amiodarone 200 mg and Eliquis were added.  The patient underwent Lexiscan Myoview yesterday which revealed severely decreased left ventricular function with an EF of 15% with normal perfusion without evidence of ischemia.  2D echocardiogram revealed LVEF 15% with moderate mitral and tricuspid regurgitation.  Of note, the patient had an echocardiogram in 2015, which revealed LVEF 45%.  The patient reports worsening of his symptoms and difficulty sleeping and shortness of breath last night, thus he came to [facility] ER.  Initial ECG revealed atrial fibrillation with rapid ventricular rate at a rate of 134 bpm without acute ST or T wave abnormalities.  The patient was given IV Lasix, started on amiodarone drip, and continued his oral Cardizem. Admission labs notable for negative initial troponin, creatinine 1.42, and BUN 30.  Currently, the patient reports feeling somewhat better than he did.  He denies chest pain.  He reports that he is breathing somewhat better.  Review of systems complete and found to be negative unless listed above     Past Medical History:  Diagnosis Date  . A-fib ([place])   . CHF (congestive  heart failure) ([place])   . Coronary artery disease   . Hypertension     History reviewed. No pertinent surgical history.  Prescriptions Prior to Admission  Medication Sig Dispense Refill Last Dose  . amiodarone (PACERONE) 100 MG tablet Take 200 mg by mouth 2 (two) times daily.   10/14/2017 at 0800  . apixaban (ELIQUIS) 5 MG TABS tablet Take 5 mg by mouth 2 (two) times daily.   10/14/2017 at 0800  . atorvastatin (LIPITOR) 80 MG tablet Take 80 mg by mouth daily.   10/14/2017 at 0800  . cyanocobalamin 1000 MCG tablet Take 1,000 mcg by mouth daily.   unknown at unknown  . diltiazem (CARDIZEM CD) 120 MG 24 hr capsule Take 120 mg by mouth daily.   10/14/2017 at 0800  . fluticasone (FLONASE) 50 MCG/ACT nasal spray Place 2 sprays into both nostrils daily.   prn at prn  . gabapentin (NEURONTIN) 300 MG capsule Take 300 mg by mouth 3 (three) times daily.   10/14/2017 at 0800  . MAGNESIUM SULFATE PO Take 500 mg by mouth as needed.   prn at prn  . meclizine (ANTIVERT) 25 MG tablet Take 25 mg by mouth as needed for dizziness.   PRN at PRN  . Multiple Vitamins-Minerals (MULTIVITAMIN WITH MINERALS) tablet Take 1 tablet by mouth daily.   10/14/2017 at 0800  . omeprazole (PRILOSEC) 40 MG capsule Take 40 mg by mouth daily.   prn at prn  . potassium chloride SA (K-DUR,KLOR-CON) 20 MEQ tablet Take 20 mEq by mouth 2 (two) times daily.   10/14/2017 at 0800  . tamsulosin (FLOMAX) 0.4 MG CAPS capsule Take 0.4 mg by  mouth 2 (two) times daily. 30 minutes after same daily meal   10/14/2017 at 0800  . timolol (BETIMOL) 0.5 % ophthalmic solution Place 1 drop into both eyes daily.   10/14/2017 at 0800  . traMADol (ULTRAM) 50 MG tablet Take 50 mg by mouth every 6 (six) hours as needed.   prn at prn   Social History   Social History  . Marital status: Unknown    Spouse name: N/A  . Number of children: N/A  . Years of education: N/A   Occupational History  . Not on file.   Social History Main Topics  . Smoking  status: Never Smoker  . Smokeless tobacco: Never Used  . Alcohol use No  . Drug use: No  . Sexual activity: Not on file   Other Topics Concern  . Not on file   Social History Narrative  . No narrative on file    Family History  Problem Relation Age of Onset  . CAD Mother   . CAD Brother       Review of systems complete and found to be negative unless listed above      PHYSICAL EXAM  General: Well developed, well nourished elderly gentleman in no acute distress HEENT:  Normocephalic and atramatic Neck:  No JVD.  Lungs: Normal effort of breathing on room air, positive for wheezing Heart: Irregularly irregular Abdomen: Bowel sounds are positive, abdomen soft Msk:  Back normal, sitting upright in bed.  Apparent normal strength for age. Extremities: No clubbing, cyanosis or edema.   Neuro: Alert and oriented X 3. Psych:  Good affect, responds appropriately  Labs:   Lab Results  Component Value Date   WBC 9.4 10/14/2017   HGB 14.9 10/14/2017   HCT 44.7 10/14/2017   MCV 99.2 10/14/2017   PLT 184 10/14/2017    Recent Labs Lab 10/14/17 1249  NA 134*  K 4.0  CL 99*  CO2 23  BUN 30*  CREATININE 1.42*  CALCIUM 9.3  PROT 6.9  BILITOT 1.1  ALKPHOS 99  ALT 78*  AST 55*  GLUCOSE 183*   Lab Results  Component Value Date   TROPONINI <0.03 10/14/2017   No results found for: CHOL No results found for: HDL No results found for: LDLCALC No results found for: TRIG No results found for: CHOLHDL No results found for: LDLDIRECT    Radiology: Dg Chest 1 View  Result Date: 10/14/2017 CLINICAL DATA:  Shortness of Breath EXAM: CHEST 1 VIEW COMPARISON:  None. FINDINGS: Bilateral perihilar and lower lobe airspace opacities likely reflects edema. Small effusions suspected. Mild cardiomegaly. Atherosclerotic change in the aortic arch. No acute bony abnormality. IMPRESSION: Cardiomegaly with bilateral perihilar and lower lobe opacities as above with small effusions, likely  CHF. Electronically Signed   By: Rolm Baptise M.D.   On: 10/14/2017 13:23    EKG: Atrial fibrillation with RVR, rate 134 bpm  ASSESSMENT AND PLAN:  1.  Atrial fibrillation with rapid ventricular rate, with known history of atrial fibrillation, seen by his cardiologist yesterday who started amiodarone, and Eliquis for stroke prevention.  Patient currently on amiodarone drip and oral Cardizem, heart rate around 120-130s now.  2.  Acute on chronic systolic heart failure, with 2-3-week history of progressive shortness of breath and worsening peripheral edema 3.  History of coronary artery disease based on prior functional studies, with negative initial troponin, and negative nuclear stress test that was performed yesterday 4.  Essential hypertension, blood pressure better controlled now 5.  Cardiomyopathy, with recent echocardiogram revealing LVEF 15%.  Echocardiogram in 2015 revealed LVEF 45%.  Recommendations: 1.  Agree with overall therapy 2.  Continue amiodarone drip for now 3.  Continue oral Cardizem 4.  Continue Eliquis 5 mg twice daily for stroke prevention 5.  Defer further cardiac diagnostics at this time as patient had nuclear stress test and echocardiogram performed yesterday 6.  Continue IV Lasix with careful monitoring of renal status 7.  Agree with adding ACE inhibitor when renal function stable, and beta-blocker 8.  Further recommendations pending patient's initial course.  Signed: Clabe Seal PA-C 10/14/2017, 5:04 PM

## 2017-10-14 NOTE — Consult Note (Signed)
  Amiodarone Drug - Drug Interaction Consult Note  Recommendations: Monitor pt for myopathies, s/sx of bleeding, hypokalemia, bradycardia. Not changes to medications needed  Amiodarone is metabolized by the cytochrome P450 system and therefore has the potential to cause many drug interactions. Amiodarone has an average plasma half-life of 50 days (range 20 to 100 days).   There is potential for drug interactions to occur several weeks or months after stopping treatment and the onset of drug interactions may be slow after initiating amiodarone.   [x]  Statins: Increased risk of myopathy. Simvastatin- restrict dose to 20mg  daily. Other statins: counsel patients to report any muscle pain or weakness immediately.  [x]  Anticoagulants: Amiodarone can increase anticoagulant effect. Consider warfarin dose reduction. Patients should be monitored closely and the dose of anticoagulant altered accordingly, remembering that amiodarone levels take several weeks to stabilize. Pt on apixaban  []  Antiepileptics: Amiodarone can increase plasma concentration of phenytoin, the dose should be reduced. Note that small changes in phenytoin dose can result in large changes in levels. Monitor patient and counsel on signs of toxicity.  []  Beta blockers: increased risk of bradycardia, AV block and myocardial depression. Sotalol - avoid concomitant use.  [x]   Calcium channel blockers (diltiazem and verapamil): increased risk of bradycardia, AV block and myocardial depression.  []   Cyclosporine: Amiodarone increases levels of cyclosporine. Reduced dose of cyclosporine is recommended.  []  Digoxin dose should be halved when amiodarone is started.  [x]  Diuretics: increased risk of cardiotoxicity if hypokalemia occurs.  []  Oral hypoglycemic agents (glyburide, glipizide, glimepiride): increased risk of hypoglycemia. Patient's glucose levels should be monitored closely when initiating amiodarone therapy.   []  Drugs that  prolong the QT interval:  Torsades de pointes risk may be increased with concurrent use - avoid if possible.  Monitor QTc, also keep magnesium/potassium WNL if concurrent therapy can't be avoided. Marland Kitchen Antibiotics: e.g. fluoroquinolones, erythromycin. . Antiarrhythmics: e.g. quinidine, procainamide, disopyramide, sotalol. . Antipsychotics: e.g. phenothiazines, haloperidol.  . Lithium, tricyclic antidepressants, and methadone. Thank You,    Ramond Dial, Pharm.D, BCPS Clinical Pharmacist  10/14/2017 3:33 PM

## 2017-10-14 NOTE — ED Provider Notes (Signed)
[facility] Emergency Department Provider Note  ____________________________________________   First MD Initiated Contact with Patient 10/14/17 1301     (approximate)  I have reviewed the triage vital signs and the nursing notes.   HISTORY  Chief Complaint Shortness of Breath   HPI Samuel Perry is a 83 y.o. male with a history of atrial fibrillation and CHF who is presenting to the emergency department today with worsening shortness of breath as well as bilateral lower extremity swelling. He says that the symptoms started this past Friday and have worsened. He was seen by his cardiologist yesterday who adjusted his medications but the patient continues to worsen today. He is taking 40 of Lasix per day, a novel anticoagulant as well asdiltiazem. He is denying any pain.   Past Medical History:  Diagnosis Date  . A-fib ([place])   . CHF (congestive heart failure) ([place])     There are no active problems to display for this patient.   History reviewed. No pertinent surgical history.  Prior to Admission medications   Not on File    Allergies Patient has no known allergies.  History reviewed. No pertinent family history.  Social History Social History  Substance Use Topics  . Smoking status: Never Smoker  . Smokeless tobacco: Never Used  . Alcohol use No    Review of Systems  Constitutional: No fever/chills Eyes: No visual changes. ENT: No sore throat. Cardiovascular: Denies chest pain. Respiratory: As above Gastrointestinal: No abdominal pain.  No nausea, no vomiting.  No diarrhea.  No constipation. Genitourinary: Negative for dysuria. Musculoskeletal: Negative for back pain. Skin: Negative for rash. Neurological: Negative for headaches, focal weakness or numbness.   ____________________________________________   PHYSICAL EXAM:  VITAL SIGNS: ED Triage Vitals  Enc Vitals Group     BP 10/14/17 1241 (!) 135/101     Pulse Rate  10/14/17 1241 (!) 116     Resp 10/14/17 1241 (!) 30     Temp --      Temp src --      SpO2 10/14/17 1236 97 %     Weight 10/14/17 1245 188 lb (85.3 kg)     Height 10/14/17 1245 5\' 11"  (1.803 m)     Head Circumference --      Peak Flow --      Pain Score --      Pain Loc --      Pain Edu? --      Excl. in [place]? --     Constitutional: Alert and oriented. Well appearing and in no acute distress. Eyes: Conjunctivae are normal.  Head: Atraumatic. Nose: No congestion/rhinnorhea. Mouth/Throat: Mucous membranes are moist.  Neck: No stridor.   Cardiovascular: Tachycardic with an irregularly irregular rhythm.. Grossly normal heart sounds.   Respiratory: Labored respirations but the patient is able to speak in full sentences. Rales to the bilateral bases. Gastrointestinal: Soft and nontender. No distention.  Musculoskeletal: No lower extremity tenderness nor edema.  No joint effusions. Neurologic:  Normal speech and language. No gross focal neurologic deficits are appreciated. Skin:  Skin is warm, dry and intact. No rash noted. Psychiatric: Mood and affect are normal. Speech and behavior are normal.  ____________________________________________   LABS (all labs ordered are listed, but only abnormal results are displayed)  Labs Reviewed  COMPREHENSIVE METABOLIC PANEL - Abnormal; Notable for the following:       Result Value   Sodium 134 (*)    Chloride 99 (*)  Glucose, Bld 183 (*)    BUN 30 (*)    Creatinine, Ser 1.42 (*)    AST 55 (*)    ALT 78 (*)    GFR calc non Af Amer 43 (*)    GFR calc Af Amer 49 (*)    All other components within normal limits  CBC WITH DIFFERENTIAL/PLATELET  TROPONIN I  BRAIN NATRIURETIC PEPTIDE   ____________________________________________  EKG  ED ECG REPORT I, Doran Stabler, the attending physician, personally viewed and interpreted this ECG.   Date: 10/14/2017  EKG Time: 1247  Rate: 134  Rhythm: atrial fibrillation, rate 134  Axis:  Normal  Intervals:none  ST&T Change: No ST segment elevation or depression. No abnormal T-wave inversion.  ____________________________________________  RADIOLOGY  CHF on the chest x-ray ____________________________________________   PROCEDURES  Procedure(s) performed: None  Procedures  Critical Care performed: No  ____________________________________________   INITIAL IMPRESSION / ASSESSMENT AND PLAN / ED COURSE  Pertinent labs & imaging results that were available during my care of the patient were reviewed by me and considered in my medical decision making (see chart for details).  Differential includes, but is not limited to, viral syndrome, bronchitis including COPD exacerbation, pneumonia, reactive airway disease including asthma, CHF including exacerbation with or without pulmonary/interstitial edema, pneumothorax, ACS, thoracic trauma, and pulmonary embolism.  As part of my medical decision making, I reviewed the following data within the [facility] chart reviewed  ----------------------------------------- 1:39 PM on 10/14/2017 -----------------------------------------  Patient given 60 mg of IV Lasix. Plan to admit the patient to the hospital. Further rate control discussed with the hospitalist, Dr. Anselm Jungling, who says that he will be ordering medicines for rate control. Patient aware of need for admission to the hospital. Breathing improved now after they 6.        ____________________________________________   FINAL CLINICAL IMPRESSION(S) / ED DIAGNOSES  Atrial fibrillation with RVR. CHF exacerbation.    NEW MEDICATIONS STARTED DURING THIS VISIT:  New Prescriptions   No medications on file     Note:  This document was prepared using Dragon voice recognition software and may include unintentional dictation errors.     Orbie Pyo, MD 10/14/17 [phone]

## 2017-10-14 NOTE — H&P (Signed)
[facility] [place] NAME: Samuel Perry    MR#:  [id number]  DATE OF BIRTH:  07-02-1927  DATE OF ADMISSION:  10/14/2017  PRIMARY CARE PHYSICIAN: Idelle Crouch, MD   REQUESTING/REFERRING PHYSICIAN: [place]  CHIEF COMPLAINT:   Chief Complaint  Patient presents with  . Shortness of Breath    HISTORY OF PRESENT ILLNESS: Eros Montour  is a 83 y.o. male with a known history of A fib, CAD, CHF, Htn- for last 2-3 weeks have worsening Edema and SOB with palpitation. Had appointment with Dr. Nehemiah Massed 2 weeks ago and scheduled to have Echo and NM stress test - done yesterday and seen in office again- started on amiodarone and eliquis yesterday. Cont to feel very SOB, could sleep only one hour last night and have palpitation. In ER noted to have CHF, and A fib with RVR. Given to admission.  PAST MEDICAL HISTORY:   Past Medical History:  Diagnosis Date  . A-fib ([place])   . CHF (congestive heart failure) ([place])   . Coronary artery disease   . Hypertension     PAST SURGICAL HISTORY: History reviewed. No pertinent surgical history.  SOCIAL HISTORY:  Social History  Substance Use Topics  . Smoking status: Never Smoker  . Smokeless tobacco: Never Used  . Alcohol use No    FAMILY HISTORY:  Family History  Problem Relation Age of Onset  . CAD Mother   . CAD Brother     DRUG ALLERGIES: No Known Allergies  REVIEW OF SYSTEMS:   CONSTITUTIONAL: No fever, fatigue or weakness.  EYES: No blurred or double vision.  EARS, NOSE, AND THROAT: No tinnitus or ear pain.  RESPIRATORY: No cough, shortness of breath, wheezing or hemoptysis.  CARDIOVASCULAR: positive for chest pain, orthopnea, edema, palpitation.  GASTROINTESTINAL: No nausea, vomiting, diarrhea or abdominal pain.  GENITOURINARY: No dysuria, hematuria.  ENDOCRINE: No polyuria, nocturia,  HEMATOLOGY: No anemia, easy bruising or bleeding SKIN: No rash or lesion. MUSCULOSKELETAL: No joint  pain or arthritis.   NEUROLOGIC: No tingling, numbness, weakness.  PSYCHIATRY: No anxiety or depression.   MEDICATIONS AT HOME:  Prior to Admission medications   Medication Sig Start Date End Date Taking? Authorizing Provider  amiodarone (PACERONE) 100 MG tablet Take 200 mg by mouth 2 (two) times daily.   Yes [provider]  apixaban (ELIQUIS) 5 MG TABS tablet Take 5 mg by mouth 2 (two) times daily.   Yes [provider]  atorvastatin (LIPITOR) 80 MG tablet Take 80 mg by mouth daily.   Yes [provider]  cyanocobalamin 1000 MCG tablet Take 1,000 mcg by mouth daily.   Yes [provider]  diltiazem (CARDIZEM CD) 120 MG 24 hr capsule Take 120 mg by mouth daily.   Yes [provider]  fluticasone (FLONASE) 50 MCG/ACT nasal spray Place 2 sprays into both nostrils daily.   Yes [provider]  gabapentin (NEURONTIN) 300 MG capsule Take 300 mg by mouth 3 (three) times daily.   Yes [provider]  MAGNESIUM SULFATE PO Take 500 mg by mouth as needed.   Yes [provider]  meclizine (ANTIVERT) 25 MG tablet Take 25 mg by mouth as needed for dizziness.   Yes [provider]  Multiple Vitamins-Minerals (MULTIVITAMIN WITH MINERALS) tablet Take 1 tablet by mouth daily.   Yes [provider]  omeprazole (PRILOSEC) 40 MG capsule Take 40 mg by mouth daily.   Yes [provider]  potassium chloride SA (K-DUR,KLOR-CON) 20 MEQ tablet Take 20 mEq by mouth 2 (two) times daily.   Yes [provider]  tamsulosin (FLOMAX) 0.4 MG CAPS capsule Take 0.4 mg by mouth 2 (two) times daily. 30 minutes after same daily meal   Yes [provider]  timolol (BETIMOL) 0.5 % ophthalmic solution Place 1 drop into both eyes daily.   Yes [provider]  traMADol (ULTRAM) 50 MG tablet Take 50 mg by mouth every 6 (six) hours as needed.   Yes [provider]      PHYSICAL EXAMINATION:   VITAL  SIGNS: Blood pressure (!) 129/100, pulse 86, resp. rate 19, height 5\' 11"  (1.803 m), weight 85.3 kg (188 lb), SpO2 98 %.  GENERAL:  83 y.o.-year-old patient lying in the bed with no acute distress.  EYES: Pupils equal, round, reactive to light and accommodation. No scleral icterus. Extraocular muscles intact.  HEENT: Head atraumatic, normocephalic. Oropharynx and nasopharynx clear.  NECK:  Supple, no jugular venous distention. No thyroid enlargement, no tenderness.  LUNGS: Normal breath sounds bilaterally, no wheezing, some crepitation. No use of accessory muscles of respiration.  CARDIOVASCULAR: S1, S2 fast and iregualar. No murmurs, rubs, or gallops.  ABDOMEN: Soft, nontender, nondistended. Bowel sounds present. No organomegaly or mass.  EXTREMITIES: b/l pedal edema, cyanosis, or clubbing.  NEUROLOGIC: Cranial nerves II through XII are intact. Muscle strength 5/5 in all extremities. Sensation intact. Gait not checked.  PSYCHIATRIC: The patient is alert and oriented x 3.  SKIN: No obvious rash, lesion, or ulcer.   LABORATORY PANEL:   CBC  Recent Labs Lab 10/14/17 1249  WBC 9.4  HGB 14.9  HCT 44.7  PLT 184  MCV 99.2  MCH 33.1  MCHC 33.4  RDW 14.3  LYMPHSABS 1.9  MONOABS 0.8  EOSABS 0.1  BASOSABS 0.1   ------------------------------------------------------------------------------------------------------------------  Chemistries   Recent Labs Lab 10/14/17 1249  NA 134*  K 4.0  CL 99*  CO2 23  GLUCOSE 183*  BUN 30*  CREATININE 1.42*  CALCIUM 9.3  AST 55*  ALT 78*  ALKPHOS 99  BILITOT 1.1   ------------------------------------------------------------------------------------------------------------------ estimated creatinine clearance is 38.3 mL/min (A) (by C-G formula based on SCr of 1.42 mg/dL (H)). ------------------------------------------------------------------------------------------------------------------ No results for input(s): TSH, T4TOTAL, T3FREE,  THYROIDAB in the last 72 hours.  Invalid input(s): FREET3   Coagulation profile No results for input(s): INR, PROTIME in the last 168 hours. ------------------------------------------------------------------------------------------------------------------- No results for input(s): DDIMER in the last 72 hours. -------------------------------------------------------------------------------------------------------------------  Cardiac Enzymes  Recent Labs Lab 10/14/17 1249  TROPONINI <0.03   ------------------------------------------------------------------------------------------------------------------ Invalid input(s): POCBNP  ---------------------------------------------------------------------------------------------------------------  Urinalysis No results found for: COLORURINE, APPEARANCEUR, LABSPEC, PHURINE, GLUCOSEU, HGBUR, BILIRUBINUR, KETONESUR, PROTEINUR, UROBILINOGEN, NITRITE, LEUKOCYTESUR   RADIOLOGY: Dg Chest 1 View  Result Date: 10/14/2017 CLINICAL DATA:  Shortness of Breath EXAM: CHEST 1 VIEW COMPARISON:  None. FINDINGS: Bilateral perihilar and lower lobe airspace opacities likely reflects edema. Small effusions suspected. Mild cardiomegaly. Atherosclerotic change in the aortic arch. No acute bony abnormality. IMPRESSION: Cardiomegaly with bilateral perihilar and lower lobe opacities as above with small effusions, likely CHF. Electronically Signed   By: Rolm Baptise M.D.   On: 10/14/2017 13:23    EKG: Orders placed or performed during the hospital encounter of 10/14/17  . ED EKG  . ED EKG    IMPRESSION AND PLAN:  * Afib with RVR   IV amio drip and cont oral Cardizem tab.   Spoke to Cardiology PA for Dr. Saralyn Pilar.  Monitor on tele and troponin.   Echo and Stress test done yesterday.   COnt eliquis.  * Ac systolic CHF   Severe cardiomyopathy   EF 15%    IV lasix, Fluid restriction, I/o monitor.   Cardio consult.   Once renal func stable, may add ACE  inh, betablocker.  * Htn   COnt lasix, Cardizem.  * Hyperlipidemia   Cont atorvastatin.  * Ac renal failure   Monitor with lasix use.  * Hyperglycemia   Check Hemoglobin A1c  All the records are reviewed and case discussed with ED provider. Management plans discussed with the patient, family and they are in agreement.  CODE STATUS: DNR Code Status History    This patient does not have a recorded code status. Please follow your organizational policy for patients in this situation.    Advance Directive Documentation     Most Recent Value  Type of Advance Directive  Healthcare Power of Attorney, Living will  Pre-existing out of facility DNR order (yellow form or pink MOST form)  -  "MOST" Form in Place?  -     Spoke to his wife in room.  TOTAL TIME TAKING CARE OF THIS PATIENT: 50 critical care minutes.    Vaughan Basta M.D on 10/14/2017   Between 7am to 6pm - Pager - [phone]  After 6pm go to [email or web address] - password [email or web address] [facility]  Office  [phone]  CC: Primary care physician; Idelle Crouch, MD   Note: This dictation was prepared with Dragon dictation along with smaller phrase technology. Any transcriptional errors that result from this process are unintentional.

## 2017-10-14 NOTE — ED Triage Notes (Signed)
Pt to ED by EMS from home with c/o of SOB. Pt states started on Friday but has increased. Pt seen yesterday by cardiologist for stress test. Pt has c/o of bilateral LE swelling and abdominal swelling.

## 2017-10-15 ENCOUNTER — Inpatient Hospital Stay: Payer: PPO

## 2017-10-15 LAB — BASIC METABOLIC PANEL
ANION GAP: 9 (ref 5–15)
BUN: 32 mg/dL — ABNORMAL HIGH (ref 6–20)
CALCIUM: 8.9 mg/dL (ref 8.9–10.3)
CO2: 26 mmol/L (ref 22–32)
CREATININE: 1.6 mg/dL — AB (ref 0.61–1.24)
Chloride: 101 mmol/L (ref 101–111)
GFR, EST AFRICAN AMERICAN: 43 mL/min — AB (ref >60)
GFR, EST NON AFRICAN AMERICAN: 37 mL/min — AB (ref >60)
Glucose, Bld: 174 mg/dL — ABNORMAL HIGH (ref 65–99)
Potassium: 4.1 mmol/L (ref 3.5–5.1)
SODIUM: 136 mmol/L (ref 135–145)

## 2017-10-15 LAB — TROPONIN I

## 2017-10-15 LAB — CBC
HCT: 41.2 % (ref 40.0–52.0)
HEMOGLOBIN: 13.7 g/dL (ref 13.0–18.0)
MCH: 32.8 pg (ref 26.0–34.0)
MCHC: 33.1 g/dL (ref 32.0–36.0)
MCV: 98.9 fL (ref 80.0–100.0)
PLATELETS: 142 10*3/uL — AB (ref 150–440)
RBC: 4.17 MIL/uL — AB (ref 4.40–5.90)
RDW: 13.7 % (ref 11.5–14.5)
WBC: 7.9 10*3/uL (ref 3.8–10.6)

## 2017-10-15 LAB — URINALYSIS, ROUTINE W REFLEX MICROSCOPIC
BILIRUBIN URINE: NEGATIVE
Glucose, UA: NEGATIVE mg/dL
Hgb urine dipstick: NEGATIVE
KETONES UR: NEGATIVE mg/dL
LEUKOCYTES UA: NEGATIVE
NITRITE: NEGATIVE
PROTEIN: NEGATIVE mg/dL
Specific Gravity, Urine: 1.017 (ref 1.005–1.030)
pH: 5 (ref 5.0–8.0)

## 2017-10-15 LAB — HEMOGLOBIN A1C
HEMOGLOBIN A1C: 6.2 % — AB (ref 4.8–5.6)
Mean Plasma Glucose: 131.24 mg/dL

## 2017-10-15 MED ORDER — AMIODARONE HCL 200 MG PO TABS
200.0000 mg | ORAL_TABLET | Freq: Two times a day (BID) | ORAL | Status: DC
  Administered 2017-10-15 – 2017-10-18 (×6): 200 mg via ORAL
  Filled 2017-10-15 (×6): qty 1

## 2017-10-15 NOTE — Progress Notes (Signed)
[facility] Cardiology  SUBJECTIVE: The patient reports feeling better this morning. At rest, he denies shortness of breath, but has dyspnea with ambulation. He denies palpitations or chest pain. He states he slept well for a fair portion of the night.   Vitals:   10/15/17 0151 10/15/17 0555 10/15/17 0744 10/15/17 0818  BP: 102/74 110/80 111/83 111/86  Pulse: (!) 117 (!) 118 90 (!) 103  Resp:  18 18 18   Temp:  97.6 F (36.4 C) 98.1 F (36.7 C)   TempSrc:  Oral    SpO2: 93% 96% 94% 93%  Weight:  84.7 kg (186 lb 11.2 oz)    Height:         Intake/Output Summary (Last 24 hours) at 10/15/17 0819 Last data filed at 10/15/17 2505  Gross per 24 hour  Intake           296.67 ml  Output              750 ml  Net          -453.33 ml      PHYSICAL EXAM  General: Well developed, well nourished, in no acute distress HEENT:  Normocephalic and atramatic Neck:  No JVD.  Lungs: No wheezing or crackles. Normal effort of breathing at rest on room air. Heart: Irregularly irregular  Abdomen: Bowel sounds are positive, abdomen soft  Msk:  Back normal, sitting upright in bed, gait not assessed. Apparent normal strength and tone for age. Extremities: Trace bilateral lower extremity edema  Neuro: Alert and oriented X 3. Psych:  Good affect, responds appropriately   LABS: Basic Metabolic Panel:  Recent Labs  10/14/17 1249 10/15/17 0308  NA 134* 136  K 4.0 4.1  CL 99* 101  CO2 23 26  GLUCOSE 183* 174*  BUN 30* 32*  CREATININE 1.42* 1.60*  CALCIUM 9.3 8.9   Liver Function Tests:  Recent Labs  10/14/17 1249  AST 55*  ALT 78*  ALKPHOS 99  BILITOT 1.1  PROT 6.9  ALBUMIN 4.0   No results for input(s): LIPASE, AMYLASE in the last 72 hours. CBC:  Recent Labs  10/14/17 1249 10/15/17 0308  WBC 9.4 7.9  NEUTROABS 6.5  --   HGB 14.9 13.7  HCT 44.7 41.2  MCV 99.2 98.9  PLT 184 142*   Cardiac Enzymes:  Recent Labs  10/14/17 1700 10/14/17 1828 10/14/17 2339  TROPONINI 0.03*  0.03* <0.03   BNP: Invalid input(s): POCBNP D-Dimer: No results for input(s): DDIMER in the last 72 hours. Hemoglobin A1C:  Recent Labs  10/14/17 1828  HGBA1C 6.2*   Fasting Lipid Panel: No results for input(s): CHOL, HDL, LDLCALC, TRIG, CHOLHDL, LDLDIRECT in the last 72 hours. Thyroid Function Tests:  Recent Labs  10/14/17 1828  TSH 3.573   Anemia Panel: No results for input(s): VITAMINB12, FOLATE, FERRITIN, TIBC, IRON, RETICCTPCT in the last 72 hours.  Dg Chest 1 View  Result Date: 10/14/2017 CLINICAL DATA:  Shortness of Breath EXAM: CHEST 1 VIEW COMPARISON:  None. FINDINGS: Bilateral perihilar and lower lobe airspace opacities likely reflects edema. Small effusions suspected. Mild cardiomegaly. Atherosclerotic change in the aortic arch. No acute bony abnormality. IMPRESSION: Cardiomegaly with bilateral perihilar and lower lobe opacities as above with small effusions, likely CHF. Electronically Signed   By: Rolm Baptise M.D.   On: 10/14/2017 13:23   Dg Chest 2 View  Result Date: 10/15/2017 CLINICAL DATA:  CHF. EXAM: CHEST  2 VIEW COMPARISON:  10/14/2017 . FINDINGS: Cardiomegaly with pulmonary  vascular prominence and bilateral interstitial prominence. Bilateral pleural effusions. Findings consistent with CHF. No interim change. IMPRESSION: Congestive heart failure with pulmonary interstitial edema bilateral pleural effusions again noted. No interim change . Electronically Signed   By: Marcello Moores  Register   On: 10/15/2017 08:05     Echo EF 15%, moderate MR, TR  TELEMETRY: atrial fibrillation, 110s-130s bpm  ASSESSMENT AND PLAN:  Principal Problem:   Atrial fibrillation with RVR (HCC) Active Problems:   Acute systolic CHF (congestive heart failure) ([place])   Cardiomyopathy ([place])    1. Atrial fibrillation with rapid ventricular rate, with known history of atrial fibrillation, seen by his cardiologist yesterday who started amiodarone, and Eliquis for stroke prevention.   Patient currently on amiodarone drip and oral Cardizem, heart rate around 110s-130s now. 2.  Acute on chronic systolic heart failure, with 2-3-week history of progressive shortness of breath and worsening peripheral edema. Patient states he is breathing much better this morning. Chest xray this morning revealed bilateral pleural effusions, consistent with CHF. 3.  History of coronary artery disease based on prior functional studies, with negative troponin, and negative nuclear stress test that was performed yesterday. 4.  Essential hypertension, blood pressure well controlled this morning 5.  Cardiomyopathy, with recent echocardiogram revealing LVEF 15%.  Echocardiogram in 2015 revealed LVEF 45%.  Recommendations: 1. Agree with overall therapy 2. Continue Eliquis for stroke prevention 3. Continue IV Lasix with careful monitoring of renal status. 4. When current amiodarone infusion is complete, discontinue it and start 200 mg BID of oral amiodarone with plans to continue for one week. Will likely reduce to 200 mg once daily at follow-up appointment next week. 5. Recommend repeat echocardiogram in future when atrial fibrillation rate controlled or in normal sinus rhythm. 6. Agree with starting Ace-I when renal function improves, as well as beta blocker for cardiomyopathy. 7. Likely discharge tomorrow if rate controlled and patient feeling better.  Clabe Seal, PA-C 10/15/2017 8:19 AM

## 2017-10-15 NOTE — Progress Notes (Signed)
[facility] at [place] NAME: Samuel Perry    MR#:  [id number]  DATE OF BIRTH:  1927/03/29  SUBJECTIVE:  CHIEF COMPLAINT:  Patient's feeling better  REVIEW OF SYSTEMS:  CONSTITUTIONAL: No fever, fatigue or weakness.  EYES: No blurred or double vision.  EARS, NOSE, AND THROAT: No tinnitus or ear pain.  RESPIRATORY: No cough, shortness of breath, wheezing or hemoptysis.  CARDIOVASCULAR: No chest pain, orthopnea, edema.  GASTROINTESTINAL: No nausea, vomiting, diarrhea or abdominal pain.  GENITOURINARY: No dysuria, hematuria.  ENDOCRINE: No polyuria, nocturia,  HEMATOLOGY: No anemia, easy bruising or bleeding SKIN: No rash or lesion. MUSCULOSKELETAL: No joint pain or arthritis.   NEUROLOGIC: No tingling, numbness, weakness.  PSYCHIATRY: No anxiety or depression.   DRUG ALLERGIES:  No Known Allergies  VITALS:  Blood pressure 111/86, pulse (!) 103, temperature 98.1 F (36.7 C), resp. rate 18, height 5\' 11"  (1.803 m), weight 84.7 kg (186 lb 11.2 oz), SpO2 93 %.  PHYSICAL EXAMINATION:  GENERAL:  83 y.o.-year-old patient lying in the bed with no acute distress.  EYES: Pupils equal, round, reactive to light and accommodation. No scleral icterus. Extraocular muscles intact.  HEENT: Head atraumatic, normocephalic. Oropharynx and nasopharynx clear.  NECK:  Supple, no jugular venous distention. No thyroid enlargement, no tenderness.  LUNGS: Mod breath sounds bilaterally, no wheezing, has min rales,rhonchi or crepitation. No use of accessory muscles of respiration.  CARDIOVASCULAR: S1, S2 normal. No murmurs, rubs, or gallops.  ABDOMEN: Soft, nontender, nondistended. Bowel sounds present. No organomegaly or mass.  EXTREMITIES: No pedal edema, cyanosis, or clubbing.  NEUROLOGIC: Cranial nerves II through XII are intact. Muscle strength 5/5 in all extremities. Sensation intact. Gait not checked.  PSYCHIATRIC: The patient is alert and oriented x 3.   SKIN: No obvious rash, lesion, or ulcer.    LABORATORY PANEL:   CBC  Recent Labs Lab 10/15/17 0308  WBC 7.9  HGB 13.7  HCT 41.2  PLT 142*   ------------------------------------------------------------------------------------------------------------------  Chemistries   Recent Labs Lab 10/14/17 1249 10/15/17 0308  NA 134* 136  K 4.0 4.1  CL 99* 101  CO2 23 26  GLUCOSE 183* 174*  BUN 30* 32*  CREATININE 1.42* 1.60*  CALCIUM 9.3 8.9  AST 55*  --   ALT 78*  --   ALKPHOS 99  --   BILITOT 1.1  --    ------------------------------------------------------------------------------------------------------------------  Cardiac Enzymes  Recent Labs Lab 10/14/17 2339  TROPONINI <0.03   ------------------------------------------------------------------------------------------------------------------  RADIOLOGY:  Dg Chest 1 View  Result Date: 10/14/2017 CLINICAL DATA:  Shortness of Breath EXAM: CHEST 1 VIEW COMPARISON:  None. FINDINGS: Bilateral perihilar and lower lobe airspace opacities likely reflects edema. Small effusions suspected. Mild cardiomegaly. Atherosclerotic change in the aortic arch. No acute bony abnormality. IMPRESSION: Cardiomegaly with bilateral perihilar and lower lobe opacities as above with small effusions, likely CHF. Electronically Signed   By: Rolm Baptise M.D.   On: 10/14/2017 13:23   Dg Chest 2 View  Result Date: 10/15/2017 CLINICAL DATA:  CHF. EXAM: CHEST  2 VIEW COMPARISON:  10/14/2017 . FINDINGS: Cardiomegaly with pulmonary vascular prominence and bilateral interstitial prominence. Bilateral pleural effusions. Findings consistent with CHF. No interim change. IMPRESSION: Congestive heart failure with pulmonary interstitial edema bilateral pleural effusions again noted. No interim change . Electronically Signed   By: Marcello Moores  Register   On: 10/15/2017 08:05    EKG:   Orders placed or performed during the hospital encounter of 10/14/17  .  ED  EKG  . ED EKG    ASSESSMENT AND PLAN:    * Afib with RVR After completing current IV amiodarone infusion will discontinue IV infusion and start the patient on 200 mg of by mouth amiodarone twice a day for a week and then will reduce to 200 mg once a day     Spoke to Cardiology PA for Dr. Saralyn Pilar. Outpatient follow-up with cardiology in 1 week after discharge   Monitor on tele.   f/u Echo with ejection fraction 15% and Stress test done yesterday.   Continue  eliquis.  * Ac systolic CHF with Severe cardiomyopathy   EF 15%    IV lasix, Fluid restriction, I/o monitor. Monitor renal function closely   Cardio following    Once renal func stable, may add ACE inh, betablocker.  *Urinalysis with hemoglobin Will repeat urinalysis  * Htn   COnt lasix, Cardizem.  * Hyperlipidemia   Cont atorvastatin.  * Ac renal failure   Monitor with lasix use. Creatinine 1.4-1.6  * Hyperglycemia   Check Hemoglobin A1c      All the records are reviewed and case discussed with Care Management/Social Workerr. Management plans discussed with the patient, family and they are in agreement.  CODE STATUS: DNR   TOTAL TIME TAKING CARE OF THIS PATIENT: 35  minutes.   POSSIBLE D/C IN 1-2 DAYS, DEPENDING ON CLINICAL CONDITION.  Note: This dictation was prepared with Dragon dictation along with smaller phrase technology. Any transcriptional errors that result from this process are unintentional.   [name] M.D on 10/15/2017 at 1:20 PM  Between 7am to 6pm - Pager - [phone] After 6pm go to [email or web address] - password [email or web address] [facility]  Office  [phone]  CC: Primary care physician; Idelle Crouch, MD

## 2017-10-15 NOTE — Progress Notes (Signed)
Patient off unit for CXR. Will resume care upon return. Samuel Perry M [name] [name]  

## 2017-10-15 NOTE — Progress Notes (Signed)
Earlier this AM the cardiology P.A. instructed this R.N. to initiate PO Amiodarone after the current bag of IV Amiodarone was finished infusing. The current bag still has about 100 cc left to infuse. Will continue to monitor. Wenda Low [facility]

## 2017-10-16 ENCOUNTER — Inpatient Hospital Stay: Payer: PPO

## 2017-10-16 LAB — CBC
HCT: 40.8 % (ref 40.0–52.0)
HEMOGLOBIN: 13.6 g/dL (ref 13.0–18.0)
MCH: 32.8 pg (ref 26.0–34.0)
MCHC: 33.3 g/dL (ref 32.0–36.0)
MCV: 98.6 fL (ref 80.0–100.0)
PLATELETS: 124 10*3/uL — AB (ref 150–440)
RBC: 4.13 MIL/uL — AB (ref 4.40–5.90)
RDW: 13.9 % (ref 11.5–14.5)
WBC: 8.1 10*3/uL (ref 3.8–10.6)

## 2017-10-16 LAB — BASIC METABOLIC PANEL
Anion gap: 8 (ref 5–15)
BUN: 37 mg/dL — AB (ref 6–20)
CHLORIDE: 100 mmol/L — AB (ref 101–111)
CO2: 26 mmol/L (ref 22–32)
CREATININE: 1.81 mg/dL — AB (ref 0.61–1.24)
Calcium: 8.6 mg/dL — ABNORMAL LOW (ref 8.9–10.3)
GFR, EST AFRICAN AMERICAN: 37 mL/min — AB (ref >60)
GFR, EST NON AFRICAN AMERICAN: 32 mL/min — AB (ref >60)
Glucose, Bld: 141 mg/dL — ABNORMAL HIGH (ref 65–99)
Potassium: 4.3 mmol/L (ref 3.5–5.1)
SODIUM: 134 mmol/L — AB (ref 135–145)

## 2017-10-16 LAB — HEMOGLOBIN A1C
HEMOGLOBIN A1C: 6.5 % — AB (ref 4.8–5.6)
MEAN PLASMA GLUCOSE: 139.85 mg/dL

## 2017-10-16 MED ORDER — FUROSEMIDE 10 MG/ML IJ SOLN
20.0000 mg | Freq: Once | INTRAMUSCULAR | Status: AC
  Administered 2017-10-16: 20 mg via INTRAVENOUS

## 2017-10-16 MED ORDER — ALPRAZOLAM 0.5 MG PO TABS
0.5000 mg | ORAL_TABLET | Freq: Two times a day (BID) | ORAL | Status: DC | PRN
  Administered 2017-10-17: 0.5 mg via ORAL
  Filled 2017-10-16 (×2): qty 1

## 2017-10-16 MED ORDER — APIXABAN 2.5 MG PO TABS
2.5000 mg | ORAL_TABLET | Freq: Two times a day (BID) | ORAL | Status: DC
  Administered 2017-10-16 – 2017-10-18 (×4): 2.5 mg via ORAL
  Filled 2017-10-16 (×4): qty 1

## 2017-10-16 MED ORDER — IPRATROPIUM-ALBUTEROL 0.5-2.5 (3) MG/3ML IN SOLN
3.0000 mL | Freq: Four times a day (QID) | RESPIRATORY_TRACT | Status: DC | PRN
  Administered 2017-10-16: 3 mL via RESPIRATORY_TRACT
  Filled 2017-10-16: qty 3

## 2017-10-16 MED ORDER — FUROSEMIDE 20 MG PO TABS: 20.0000 mg | ORAL_TABLET | Freq: Two times a day (BID) | ORAL | Status: DC

## 2017-10-16 MED ORDER — FUROSEMIDE 20 MG PO TABS
20.0000 mg | ORAL_TABLET | Freq: Two times a day (BID) | ORAL | Status: DC
  Administered 2017-10-17: 20 mg via ORAL
  Filled 2017-10-16: qty 1

## 2017-10-16 MED ORDER — FUROSEMIDE 10 MG/ML IJ SOLN
INTRAMUSCULAR | Status: AC
  Filled 2017-10-16: qty 2

## 2017-10-16 NOTE — Progress Notes (Signed)
Patient complaining of SOB. Lung sounds tight and expiratory wheezes after exertion. SPO2 96 on RA. Dr. Margaretmary Eddy notified orders received for a chest xray, Duoneb q6h prn, 20 mg IV lasix once. Will proceed and continue to monitor.

## 2017-10-16 NOTE — Care Management (Signed)
Patient admitted from home with atrial fib with RVR and exac of heart failure.  He is on  chronic Eliquis.  Has access to scales for daily weights.  IV lasix and caused some decline in renal function.  He  ill be started on oral 10/27 since he has already received an IV dose today. He is on fluid restriction.  Made referral to dietician for heart failure diet and eduction for implementing fluid restriction in the home. Spoke with patient regarding home health nurse follow up and he would like to discuss this with his wife.  Discussed that at present, [employer] does have copays. He will discuss with his wife.  Confirmed patient's contact information.  Denies issues with adls accessing medical care, obtaining medications or with transportation.  No physical therapy follow up recommended

## 2017-10-16 NOTE — Consult Note (Signed)
   [facility] CM Inpatient Consult   [id number]  Givanni Staron [facility] [id number] [phone]   Referral received by inpatient case manager for [place] Management services and post hospital discharge follow up related to a diagnosis of Heart Failure. Patient was evaluated for community based chronic disease management services with [facility] care Management Program as a benefit of patient's  [employer] Medicare. Met with the patient and spouse Lib at the bedside to explain [facility] services. Patient endorses his primary care provider to be Georgie Chard MD. Patient states he was playing golf just last week but stared to feel unwell. He stated he ended up coming to the ED. He stated he noted his appetite was declining yet his weight going up and he felt more SOB. He stated he was able to walk today but stated he felt a bit weaker than normal. Verbal consent received for [facility] services. Patient gave [phone] as the best number to reach him. He also gave written permission to speak with his spouse Lib if he can not be reached. Patient will receive post hospital discharge calls and be evaluated for monthly home visits. [facility] Care Management services does not interfere with or replace any services arranged by the inpatient care management team. RNCM left contact information and [facility] literature at the bedside. Made inpatient RNCM aware that [facility] will be following for care management. For additional questions please contact:   Carliyah Cotterman RN, [facility] Liaison  [phone]) Business Mobile [phone]) Toll free office

## 2017-10-16 NOTE — Care Management Important Message (Signed)
Important Message  Patient Details  Name: Samuel Perry MRN: [id number] Date of Birth: 1927-08-08   Medicare Important Message Given:  Yes Signed IM notice given    Katrina Stack, RN 10/16/2017, 11:11 AM

## 2017-10-16 NOTE — Evaluation (Addendum)
Physical Therapy Evaluation Patient Details Name: Samuel Perry MRN: [id number] DOB: [birth year] Today's Date: 10/16/2017   History of Present Illness  Jaz Mallick  is a 83 y.o. male with a known history of A fib, CAD, CHF, Htn- for last 2-3 weeks have worsening Edema and SOB with palpitation.  Clinical Impression  Pt presents to PT limited by SOB, decreased dynamic balance, and difficulty walking and would benefit from acute PT services to address objective findings.  Pt with good baseline strength and very active prior to admission, playing golf 3x/week.  Expect pt to progress well with return to daily routine and slowly progressing activity as tolerated.  Educated pt to ambulate today with assist from nursing.    Follow Up Recommendations No PT follow up    Equipment Recommendations    Kasandra Knudsen (has own)   Recommendations for Other Services   none    Precautions / Restrictions Precautions Precautions: Fall Restrictions Weight Bearing Restrictions: No      Mobility  Bed Mobility Overal bed mobility: Modified Independent             General bed mobility comments: Extra time, effort to elevate trunk and rotate hips to EOB with bed flat.  Transfers Overall transfer level: Modified independent Equipment used: None             General transfer comment: Rising on first attempt but with increased posterior weight shift, using back of legs to brace self against bed and using R bed rail to steady self.  Ambulation/Gait Ambulation/Gait assistance: Supervision Ambulation Distance (Feet): 400 Feet Assistive device: Rolling walker (2 wheeled) Gait Pattern/deviations: Step-through pattern     General Gait Details: Overall steady gait with use of RW, trial of gait wihtout RW and pt required use of wall rail for security.  Stairs            Wheelchair Mobility    Modified Rankin (Stroke Patients Only)       Balance Overall balance assessment: Needs  assistance Sitting-balance support: Feet supported Sitting balance-Leahy Scale: Normal Sitting balance - Comments: Maintains midline without deviation. Postural control: Posterior lean (upon standing) Standing balance support: Single extremity supported;During functional activity Standing balance-Leahy Scale: Fair Standing balance comment: Needs upper extremity support for balance.                             Pertinent Vitals/Pain Pain Assessment: No/denies pain    Home Living Family/patient expects to be discharged to:: Private residence Living Arrangements: Spouse/significant other   Type of Home: House Home Access: Level entry;Stairs to enter Entrance Stairs-Rails: None Entrance Stairs-Number of Steps: 3 Home Layout: Two level Home Equipment: Grab bars - tub/shower;Cane - single point      Prior Function Level of Independence: Independent         Comments: Plays golf 3x/week, very active     Hand Dominance   Dominant Hand: Right    Extremity/Trunk Assessment   Upper Extremity Assessment Upper Extremity Assessment: Overall WFL for tasks assessed    Lower Extremity Assessment Lower Extremity Assessment: Overall WFL for tasks assessed    Cervical / Trunk Assessment Cervical / Trunk Assessment: Normal  Communication   Communication: No difficulties  Cognition Arousal/Alertness: Awake/alert Behavior During Therapy: WFL for tasks assessed/performed Overall Cognitive Status: Within Functional Limits for tasks assessed  General Comments: Plesant, answers all questions appropriately      General Comments General comments (skin integrity, edema, etc.): SOB after ambulation, O2 sats at 97% and HR 112    Exercises     Assessment/Plan    PT Assessment Patient needs continued PT services  PT Problem List Decreased activity tolerance;Decreased balance;Cardiopulmonary status limiting activity       PT  Treatment Interventions Gait training;DME instruction;Functional mobility training;Therapeutic activities;Therapeutic exercise;Balance training;Patient/family education    PT Goals (Current goals can be found in the Care Plan section)  Acute Rehab PT Goals Patient Stated Goal: To go home. PT Goal Formulation: With patient Time For Goal Achievement: 10/23/17 Potential to Achieve Goals: Good    Frequency Min 2X/week   Barriers to discharge        Co-evaluation               AM-PAC PT "6 Clicks" Daily Activity  Outcome Measure Difficulty turning over in bed (including adjusting bedclothes, sheets and blankets)?: None Difficulty moving from lying on back to sitting on the side of the bed? : A Little Difficulty sitting down on and standing up from a chair with arms (e.g., wheelchair, bedside commode, etc,.)?: A Little Help needed moving to and from a bed to chair (including a wheelchair)?: A Little Help needed walking in hospital room?: None Help needed climbing 3-5 steps with a railing? : None 6 Click Score: 21    End of Session Equipment Utilized During Treatment: Gait belt Activity Tolerance: Patient tolerated treatment well Patient left: in bed;with family/visitor present Nurse Communication: Mobility status (Ambulate pt in afternoon.) PT Visit Diagnosis: Difficulty in walking, not elsewhere classified (R26.2)    Time: 9163-8466 PT Time Calculation (min) (ACUTE ONLY): 22 min   Charges:   PT Evaluation $PT Eval Low Complexity: 1 Low     PT G Codes:   PT G-Codes **NOT FOR INPATIENT CLASS** Functional Assessment Tool Used: AM-PAC 6 Clicks Basic Mobility Functional Limitation: Mobility: Walking and moving around Mobility: Walking and Moving Around Current Status (Z9935): At least 20 percent but less than 40 percent impaired, limited or restricted Mobility: Walking and Moving Around Goal Status [phone]): At least 1 percent but less than 20 percent impaired, limited or  restricted    [employer], PT 10/16/2017, 10:52 AM

## 2017-10-16 NOTE — Progress Notes (Signed)
[facility] [place] NAME: Samuel Perry    MR#:  [id number]  DATE OF BIRTH:  08-28-1927  SUBJECTIVE:  CHIEF COMPLAINT:  Patient's reporting shortness of breath with exertion  REVIEW OF SYSTEMS:  CONSTITUTIONAL: No fever, fatigue or weakness.  EYES: No blurred or double vision.  EARS, NOSE, AND THROAT: No tinnitus or ear pain.  RESPIRATORY: No cough, shortness of breath, wheezing or hemoptysis.  CARDIOVASCULAR: No chest pain, orthopnea, edema.  GASTROINTESTINAL: No nausea, vomiting, diarrhea or abdominal pain.  GENITOURINARY: No dysuria, hematuria.  ENDOCRINE: No polyuria, nocturia,  HEMATOLOGY: No anemia, easy bruising or bleeding SKIN: No rash or lesion. MUSCULOSKELETAL: No joint pain or arthritis.   NEUROLOGIC: No tingling, numbness, weakness.  PSYCHIATRY: No anxiety or depression.   DRUG ALLERGIES:  No Known Allergies  VITALS:  Blood pressure 113/83, pulse (!) 127, temperature 97.7 F (36.5 C), temperature source Oral, resp. rate 20, height 5\' 11"  (1.803 m), weight 85.6 kg (188 lb 12.8 oz), SpO2 96 %.  PHYSICAL EXAMINATION:  GENERAL:  83 y.o.-year-old patient lying in the bed with no acute distress.  EYES: Pupils equal, round, reactive to light and accommodation. No scleral icterus. Extraocular muscles intact.  HEENT: Head atraumatic, normocephalic. Oropharynx and nasopharynx clear.  NECK:  Supple, no jugular venous distention. No thyroid enlargement, no tenderness.  LUNGS: Mod breath sounds bilaterally, no wheezing, has min rales,rhonchi or crepitation. No use of accessory muscles of respiration.  CARDIOVASCULAR: S1, S2 normal. No murmurs, rubs, or gallops.  ABDOMEN: Soft, nontender, nondistended. Bowel sounds present. No organomegaly or mass.  EXTREMITIES: No pedal edema, cyanosis, or clubbing.  NEUROLOGIC: Cranial nerves II through XII are intact. Muscle strength 5/5 in all extremities. Sensation intact. Gait not checked.   PSYCHIATRIC: The patient is alert and oriented x 3.  SKIN: No obvious rash, lesion, or ulcer.    LABORATORY PANEL:   CBC  Recent Labs Lab 10/16/17 0331  WBC 8.1  HGB 13.6  HCT 40.8  PLT 124*   ------------------------------------------------------------------------------------------------------------------  Chemistries   Recent Labs Lab 10/14/17 1249  10/16/17 0331  NA 134*  < > 134*  K 4.0  < > 4.3  CL 99*  < > 100*  CO2 23  < > 26  GLUCOSE 183*  < > 141*  BUN 30*  < > 37*  CREATININE 1.42*  < > 1.81*  CALCIUM 9.3  < > 8.6*  AST 55*  --   --   ALT 78*  --   --   ALKPHOS 99  --   --   BILITOT 1.1  --   --   < > = values in this interval not displayed. ------------------------------------------------------------------------------------------------------------------  Cardiac Enzymes  Recent Labs Lab 10/14/17 2339  TROPONINI <0.03   ------------------------------------------------------------------------------------------------------------------  RADIOLOGY:  Dg Chest 2 View  Result Date: 10/15/2017 CLINICAL DATA:  CHF. EXAM: CHEST  2 VIEW COMPARISON:  10/14/2017 . FINDINGS: Cardiomegaly with pulmonary vascular prominence and bilateral interstitial prominence. Bilateral pleural effusions. Findings consistent with CHF. No interim change. IMPRESSION: Congestive heart failure with pulmonary interstitial edema bilateral pleural effusions again noted. No interim change . Electronically Signed   By: Marcello Moores  Register   On: 10/15/2017 08:05    EKG:   Orders placed or performed during the hospital encounter of 10/14/17  . ED EKG  . ED EKG    ASSESSMENT AND PLAN:    * Afib with RVR Improved Discontinued IV amiodarone infusion Started the  patient on 200 mg of by mouth amiodarone twice a day for a week and then will reduce to 200 mg once a day     Spoke to Cardiology PA for Dr. Saralyn Pilar. Outpatient follow-up with cardiology in 1 week after discharge   Monitor  on tele.   f/u Echo with ejection fraction 15% and Stress test done yesterday.   Continue  eliquis.  * Ac systolic CHF with Severe cardiomyopathy   EF 15%    IV lasix changed to by mouth Lasix secondary to renal insufficiency  Fluid restriction, I/o monitor.    Cardio following    Once renal func stable, may add ACE inh, betablocker.  *Urinalysis with hemoglobin Negative hemoglobin on repeat urinalysis  * Htn   COnt lasix, Cardizem.  * Hyperlipidemia   Cont atorvastatin.  * Ac renal failure    changeD Lasix 20 mg IV 3 times a day to by mouth Lasix 20 mg twice a day from tomorrow Creatinine 1.4-1.6-1.8  * Hyperglycemia   Check Hemoglobin A1c  PT evaluation    All the records are reviewed and case discussed with Care Management/Social Workerr. Management plans discussed with the patient, family and they are in agreement.  CODE STATUS: DNR   TOTAL TIME TAKING CARE OF THIS PATIENT: 35  minutes.   POSSIBLE D/C IN 1-2 DAYS, DEPENDING ON CLINICAL CONDITION.  Note: This dictation was prepared with Dragon dictation along with smaller phrase technology. Any transcriptional errors that result from this process are unintentional.   [name] M.D on 10/16/2017 at 4:17 PM  Between 7am to 6pm - Pager - [phone] After 6pm go to [email or web address] - password [email or web address] [facility]  Office  [phone]  CC: Primary care physician; Idelle Crouch, MD

## 2017-10-16 NOTE — Progress Notes (Signed)
[facility] Cardiology  SUBJECTIVE: Patient lying in bed, denies chest pain or shortness of breath   Vitals:   10/15/17 0818 10/15/17 1612 10/15/17 2009 10/16/17 0408  BP: 111/86 116/88 118/78 108/73  Pulse: (!) 103 71 (!) 112 (!) 107  Resp: 18 14 18 18   Temp:    97.7 F (36.5 C)  TempSrc:    Oral  SpO2: 93% 93% 97% 96%  Weight:    85.6 kg (188 lb 12.8 oz)  Height:         Intake/Output Summary (Last 24 hours) at 10/16/17 8242 Last data filed at 10/16/17 0644  Gross per 24 hour  Intake              720 ml  Output              900 ml  Net             -180 ml      PHYSICAL EXAM  General: Well developed, well nourished, in no acute distress HEENT:  Normocephalic and atramatic Neck:  No JVD.  Lungs: Clear bilaterally to auscultation and percussion. Heart: Irregular irregular rhythm. Normal S1 and S2 without gallops or murmurs.  Abdomen: Bowel sounds are positive, abdomen soft and non-tender  Msk:  Back normal, normal gait. Normal strength and tone for age. Extremities: No clubbing, cyanosis or edema.   Neuro: Alert and oriented X 3. Psych:  Good affect, responds appropriately   LABS: Basic Metabolic Panel:  Recent Labs  10/15/17 0308 10/16/17 0331  NA 136 134*  K 4.1 4.3  CL 101 100*  CO2 26 26  GLUCOSE 174* 141*  BUN 32* 37*  CREATININE 1.60* 1.81*  CALCIUM 8.9 8.6*   Liver Function Tests:  Recent Labs  10/14/17 1249  AST 55*  ALT 78*  ALKPHOS 99  BILITOT 1.1  PROT 6.9  ALBUMIN 4.0   No results for input(s): LIPASE, AMYLASE in the last 72 hours. CBC:  Recent Labs  10/14/17 1249 10/15/17 0308 10/16/17 0331  WBC 9.4 7.9 8.1  NEUTROABS 6.5  --   --   HGB 14.9 13.7 13.6  HCT 44.7 41.2 40.8  MCV 99.2 98.9 98.6  PLT 184 142* 124*   Cardiac Enzymes:  Recent Labs  10/14/17 1700 10/14/17 1828 10/14/17 2339  TROPONINI 0.03* 0.03* <0.03   BNP: Invalid input(s): POCBNP D-Dimer: No results for input(s): DDIMER in the last 72 hours. Hemoglobin  A1C:  Recent Labs  10/14/17 1828  HGBA1C 6.2*   Fasting Lipid Panel: No results for input(s): CHOL, HDL, LDLCALC, TRIG, CHOLHDL, LDLDIRECT in the last 72 hours. Thyroid Function Tests:  Recent Labs  10/14/17 1828  TSH 3.573   Anemia Panel: No results for input(s): VITAMINB12, FOLATE, FERRITIN, TIBC, IRON, RETICCTPCT in the last 72 hours.  Dg Chest 1 View  Result Date: 10/14/2017 CLINICAL DATA:  Shortness of Breath EXAM: CHEST 1 VIEW COMPARISON:  None. FINDINGS: Bilateral perihilar and lower lobe airspace opacities likely reflects edema. Small effusions suspected. Mild cardiomegaly. Atherosclerotic change in the aortic arch. No acute bony abnormality. IMPRESSION: Cardiomegaly with bilateral perihilar and lower lobe opacities as above with small effusions, likely CHF. Electronically Signed   By: Rolm Baptise M.D.   On: 10/14/2017 13:23   Dg Chest 2 View  Result Date: 10/15/2017 CLINICAL DATA:  CHF. EXAM: CHEST  2 VIEW COMPARISON:  10/14/2017 . FINDINGS: Cardiomegaly with pulmonary vascular prominence and bilateral interstitial prominence. Bilateral pleural effusions. Findings consistent with CHF. No  interim change. IMPRESSION: Congestive heart failure with pulmonary interstitial edema bilateral pleural effusions again noted. No interim change . Electronically Signed   By: Marcello Moores  Register   On: 10/15/2017 08:05     Echo LVEF 15%  TELEMETRY: Atrial fibrillation 100-120 bpm:  ASSESSMENT AND PLAN:  Principal Problem:   Atrial fibrillation with RVR (HCC) Active Problems:   Acute systolic CHF (congestive heart failure) ([place])   Cardiomyopathy ([place])    1.  Atrial fibrillation, with rapid ventricular rate, improved after the amiodarone bolus and drip, on oral amiodarone 2.  Acute on chronic systolic congestive heart failure, known cardiomyopathy, LVEF 15%, improved after diuresis  Recommendations  1.  Continue current medications 2.  Continue Eliquis for stroke prevention  3.   Continue amiodarone 200 mg twice daily 4.  Continue diuresis 5.  Carefully monitor renal status 6.  Ambulate today, if patient does well, consider discharge home, follow-up Dr. Nehemiah Massed in 1 week 7.  Continue outpatient, maintenance Lasix   Isaias Cowman, MD, PhD, [facility] 10/16/2017 8:26 AM

## 2017-10-16 NOTE — Care Management (Addendum)
Accepted [facility] referral.  an appointment has been made with heart failure clinic

## 2017-10-16 NOTE — Plan of Care (Signed)
Problem: Food- and Nutrition-Related Knowledge Deficit (NB-1.1) Goal: Nutrition education Formal process to instruct or train a patient/client in a skill or to impart knowledge to help patients/clients voluntarily manage or modify food choices and eating behavior to maintain or improve health. Outcome: Adequate for Discharge Nutrition Education Note  RD consulted for nutrition education regarding CHF. Discussed with patient importance of 1286mL fluid restriction and how to manage at home. Wife states she does not cook with salt currently. Normally eats 3 meals per day. No apparent needs.  RD provided "Low Sodium Nutrition Therapy" handout from the Academy of [employer] and Dietetics. Reviewed patient's dietary recall. Provided examples on ways to decrease sodium intake in diet. Discouraged intake of processed foods and use of salt shaker. Encouraged fresh fruits and vegetables as well as whole grain sources of carbohydrates to maximize fiber intake.   RD discussed why it is important for patient to adhere to diet recommendations, and emphasized the role of fluids, foods to avoid, and importance of weighing self daily. Teach back method used.  Expect fair compliance.  Body mass index is 26.33 kg/m. Pt meets criteria for overweight based on current BMI.  Current diet order is heart healthy, patient is consuming approximately 100% of meals at this time. Labs and medications reviewed. No further nutrition interventions warranted at this time. RD contact information provided. If additional nutrition issues arise, please re-consult RD.   Satira Anis. Montie Gelardi, MS, RD LDN Inpatient Clinical Dietitian Pager [phone]

## 2017-10-17 LAB — BASIC METABOLIC PANEL
ANION GAP: 6 (ref 5–15)
BUN: 39 mg/dL — ABNORMAL HIGH (ref 6–20)
CO2: 26 mmol/L (ref 22–32)
Calcium: 8.4 mg/dL — ABNORMAL LOW (ref 8.9–10.3)
Chloride: 102 mmol/L (ref 101–111)
Creatinine, Ser: 1.77 mg/dL — ABNORMAL HIGH (ref 0.61–1.24)
GFR calc Af Amer: 38 mL/min — ABNORMAL LOW (ref >60)
GFR calc non Af Amer: 33 mL/min — ABNORMAL LOW (ref >60)
GLUCOSE: 186 mg/dL — AB (ref 65–99)
POTASSIUM: 4.3 mmol/L (ref 3.5–5.1)
Sodium: 134 mmol/L — ABNORMAL LOW (ref 135–145)

## 2017-10-17 MED ORDER — BISOPROLOL FUMARATE 5 MG PO TABS
2.5000 mg | ORAL_TABLET | Freq: Every day | ORAL | Status: DC
  Administered 2017-10-17: 2.5 mg via ORAL
  Filled 2017-10-17 (×2): qty 0.5

## 2017-10-17 MED ORDER — FUROSEMIDE 10 MG/ML IJ SOLN
INTRAMUSCULAR | Status: AC
  Filled 2017-10-17: qty 4

## 2017-10-17 MED ORDER — DILTIAZEM HCL ER COATED BEADS 180 MG PO CP24
180.0000 mg | ORAL_CAPSULE | Freq: Every day | ORAL | Status: DC
  Administered 2017-10-18: 180 mg via ORAL
  Filled 2017-10-17: qty 1

## 2017-10-17 MED ORDER — FUROSEMIDE 10 MG/ML IJ SOLN
80.0000 mg | Freq: Two times a day (BID) | INTRAMUSCULAR | Status: DC
  Administered 2017-10-17 – 2017-10-18 (×3): 80 mg via INTRAVENOUS
  Filled 2017-10-17 (×2): qty 8

## 2017-10-17 MED ORDER — IPRATROPIUM-ALBUTEROL 0.5-2.5 (3) MG/3ML IN SOLN
3.0000 mL | Freq: Four times a day (QID) | RESPIRATORY_TRACT | Status: DC
  Administered 2017-10-17 – 2017-10-18 (×4): 3 mL via RESPIRATORY_TRACT
  Filled 2017-10-17 (×4): qty 3

## 2017-10-17 NOTE — Progress Notes (Signed)
Patient ID: Samuel Perry, male   DOB: [birth year], 83 y.o.   MRN: [id number]  [employer] PROGRESS NOTE  [facility] GYI:[id number] DOB: [birth year] DOA: 10/14/2017 PCP: Idelle Crouch, MD  HPI/Subjective: Patient has gained weight since yesterday.  Lasix was on hold with elevated creatinine.  Patient is now on oxygen.  Feels short of breath.  Concerned about his weight gain.  Objective: Vitals:   10/17/17 0433 10/17/17 0806  BP: 103/72 102/76  Pulse: 61 84  Resp: 18   Temp: 97.6 F (36.4 C)   SpO2: 95% 99%    Filed Weights   10/15/17 0555 10/16/17 0408 10/17/17 0500  Weight: 84.7 kg (186 lb 11.2 oz) 85.6 kg (188 lb 12.8 oz) 85.7 kg (189 lb)    ROS: Review of Systems  Constitutional: Negative for chills and fever.  Eyes: Negative for blurred vision.  Respiratory: Positive for shortness of breath. Negative for cough.   Cardiovascular: Negative for chest pain.  Gastrointestinal: Negative for abdominal pain, constipation, diarrhea, nausea and vomiting.  Genitourinary: Negative for dysuria.  Musculoskeletal: Negative for joint pain.  Neurological: Negative for dizziness and headaches.   Exam: Physical Exam  Constitutional: He is oriented to person, place, and time.  HENT:  Nose: No mucosal edema.  Mouth/Throat: No oropharyngeal exudate or posterior oropharyngeal edema.  Eyes: Pupils are equal, round, and reactive to light. Conjunctivae, EOM and lids are normal.  Neck: No JVD present. Carotid bruit is not present. No edema present. No thyroid mass and no thyromegaly present.  Cardiovascular: S1 normal and S2 normal.  Exam reveals no gallop.   No murmur heard. Pulses:      Dorsalis pedis pulses are 2+ on the right side, and 2+ on the left side.  Respiratory: No respiratory distress. He has decreased breath sounds in the right middle field, the right lower field, the left middle field and the left lower field. He has no wheezes. He has rhonchi in the right middle field and  the left middle field. He has rales in the right lower field and the left lower field.  GI: Soft. Bowel sounds are normal. There is no tenderness.  Musculoskeletal:       Right ankle: He exhibits swelling.       Left ankle: He exhibits swelling.  Lymphadenopathy:    He has no cervical adenopathy.  Neurological: He is alert and oriented to person, place, and time. No cranial nerve deficit.  Skin: Skin is warm. No rash noted. Nails show no clubbing.  Psychiatric: He has a normal mood and affect.      Data Reviewed: Basic Metabolic Panel:  Recent Labs Lab 10/14/17 1249 10/15/17 0308 10/16/17 0331 10/17/17 0420  NA 134* 136 134* 134*  K 4.0 4.1 4.3 4.3  CL 99* 101 100* 102  CO2 23 26 26 26   GLUCOSE 183* 174* 141* 186*  BUN 30* 32* 37* 39*  CREATININE 1.42* 1.60* 1.81* 1.77*  CALCIUM 9.3 8.9 8.6* 8.4*   Liver Function Tests:  Recent Labs Lab 10/14/17 1249  AST 55*  ALT 78*  ALKPHOS 99  BILITOT 1.1  PROT 6.9  ALBUMIN 4.0   CBC:  Recent Labs Lab 10/14/17 1249 10/15/17 0308 10/16/17 0331  WBC 9.4 7.9 8.1  NEUTROABS 6.5  --   --   HGB 14.9 13.7 13.6  HCT 44.7 41.2 40.8  MCV 99.2 98.9 98.6  PLT 184 142* 124*   Cardiac Enzymes:  Recent Labs Lab 10/14/17 1249 10/14/17  1700 10/14/17 1828 10/14/17 2339  TROPONINI <0.03 0.03* 0.03* <0.03   BNP (last 3 results)  Recent Labs  10/14/17 1249  BNP 1,134.0*     Studies: Dg Chest 1 View  Result Date: 10/16/2017 CLINICAL DATA:  Increased wheezing and shortness of breath EXAM: CHEST 1 VIEW COMPARISON:  10/15/2017, 10/14/2017 FINDINGS: Mild cardiomegaly. Similar appearance of vascular congestion and hazy bibasilar opacity. Small pleural effusions. Aortic atherosclerosis. No pneumothorax. IMPRESSION: Continued cardiomegaly, small pleural effusions, vascular congestion and pulmonary edema without much interval change. Mild bibasilar atelectasis or infiltrates. Electronically Signed   By: Donavan Foil M.D.   On:  10/16/2017 19:31    Scheduled Meds: . amiodarone  200 mg Oral BID  . apixaban  2.5 mg Oral BID  . atorvastatin  80 mg Oral Daily  . bisoprolol  2.5 mg Oral QHS  . [START ON 10/18/2017] diltiazem  180 mg Oral Daily  . fluticasone  2 spray Each Nare Daily  . furosemide      . furosemide  80 mg Intravenous BID  . gabapentin  300 mg Oral TID  . ipratropium-albuterol  3 mL Nebulization Q6H  . multivitamin with minerals  1 tablet Oral Daily  . pantoprazole  40 mg Oral Daily  . potassium chloride SA  20 mEq Oral BID  . sodium chloride flush  3 mL Intravenous Q12H  . tamsulosin  0.4 mg Oral BID  . timolol  1 drop Both Eyes Daily  . cyanocobalamin  1,000 mcg Oral Daily    Assessment/Plan:  1. Acute on chronic systolic congestive heart failure with severe cardiomyopathy with an EF of 10-15%.  Change Lasix back to IV 80 mg IV twice daily.  Reevaluate tomorrow.  Start low-dose bisoprolol tonight.  No ACE inhibitor or arm secondary to worsening renal function from baseline. 2. Atrial fibrillation with rapid ventricular response.  Continue amiodarone.  Increase dose of Cardizem CD to 180 mg daily.  Add low-dose bisoprolol.  Patient on Eliquis.  Case discussed with Dr. Clayborn Bigness covering for Dr. Lorinda Creed. 3. Acute kidney injury on chronic kidney disease stage III.  Monitor closely with diuresis. 4. Essential hypertension.  Continue to monitor with rate controlling medications 5. History of CAD 6. Hyperlipidemia unspecified on atorvastatin 7. Glaucoma on timolol 8. Neuropathy on gabapentin 9. BPH on Flomax  Code Status:     Code Status Orders        Start     Ordered   10/14/17 1551  Do not attempt resuscitation (DNR)  Continuous    Question Answer Comment  In the event of cardiac or respiratory ARREST Do not call a "code blue"   In the event of cardiac or respiratory ARREST Do not perform Intubation, CPR, defibrillation or ACLS   In the event of cardiac or respiratory ARREST Use  medication by any route, position, wound care, and other measures to relive pain and suffering. May use oxygen, suction and manual treatment of airway obstruction as needed for comfort.      10/14/17 1550    Code Status History    Date Active Date Inactive Code Status Order ID Comments User Context   This patient has a current code status but no historical code status.    Advance Directive Documentation     Most Recent Value  Type of Advance Directive  Healthcare Power of Attorney  Pre-existing out of facility DNR order (yellow form or pink MOST form)  -  "MOST" Form in Place?  -  Family Communication: Wife at the bedside Disposition Plan: To be determined  Consultants:  Cardiology  Time spent: 30 minutes  [facility], [place]

## 2017-10-17 NOTE — Progress Notes (Signed)
Physical Therapy Treatment Patient Details Name: Samuel Perry MRN: [id number] DOB: 1927-08-05 Today's Date: 10/17/2017    History of Present Illness Samuel Perry  is a 83 y.o. male with a known history of A fib, CAD, CHF, Htn- for last 2-3 weeks have worsening Edema and SOB with palpitation.    PT Comments    Pt resting in bed, ready to ambulate.  2 lpm 98% P 71.  Pt stated he did not use oxygen last session to ambulate and is only wears if for comfort in bed.  Offered to obtain O2 tank and he stated "I don't need it".  Pt ambulated x 2 around small nursing unit with walker and min guard/min assist at times.  O2 sats 94% after one lap, 89% after second lap with increased SOB noted.  Returned pt to O2 at 2 lpm and sats increased to mid 38' with rest.  Pt did want to try a few steps using rail in hallway which he did but with min guard and generally decreased gait quality.    Recommended to pt and wife to use RW at all times upon discharge until strength increased.    Re-check qualifying O2 sats before discharge for possible home O2. Pt stated he did not wear it prior to admission.   Follow Up Recommendations  Home health PT     Equipment Recommendations   rolling walker - wife stated they may have one at home and she will check    Recommendations for Other Services       Precautions / Restrictions Precautions Precautions: Fall Restrictions Weight Bearing Restrictions: No    Mobility  Bed Mobility Overal bed mobility: Modified Independent             General bed mobility comments: Extra time, effort to elevate trunk and rotate hips to EOB with bed flat.  Transfers Overall transfer level: Modified independent Equipment used: Rolling walker (2 wheeled)             General transfer comment: Verbal cues for hand placements  Ambulation/Gait Ambulation/Gait assistance: Min guard;Min assist Ambulation Distance (Feet): 250 Feet Assistive device: Rolling walker (2  wheeled) Gait Pattern/deviations: Step-through pattern   Gait velocity interpretation: <1.8 ft/sec, indicative of risk for recurrent falls General Gait Details: Some decreased gait quality requiring min a x 1 at times   Stairs            Wheelchair Mobility    Modified Rankin (Stroke Patients Only)       Balance Overall balance assessment: Needs assistance Sitting-balance support: Feet supported Sitting balance-Leahy Scale: Normal Sitting balance - Comments: Maintains midline without deviation.   Standing balance support: During functional activity;Bilateral upper extremity supported Standing balance-Leahy Scale: Fair Standing balance comment: Needs upper extremity support for balance.                            Cognition Arousal/Alertness: Awake/alert Behavior During Therapy: WFL for tasks assessed/performed Overall Cognitive Status: Within Functional Limits for tasks assessed                                        Exercises      General Comments        Pertinent Vitals/Pain Pain Assessment: No/denies pain    Home Living  Prior Function            PT Goals (current goals can now be found in the care plan section) Progress towards PT goals: Progressing toward goals    Frequency    Min 2X/week      PT Plan Discharge plan needs to be updated    Co-evaluation              AM-PAC PT "6 Clicks" Daily Activity  Outcome Measure  Difficulty turning over in bed (including adjusting bedclothes, sheets and blankets)?: None Difficulty moving from lying on back to sitting on the side of the bed? : A Little Difficulty sitting down on and standing up from a chair with arms (e.g., wheelchair, bedside commode, etc,.)?: A Little Help needed moving to and from a bed to chair (including a wheelchair)?: A Little Help needed walking in hospital room?: A Little Help needed climbing 3-5 steps with a  railing? : A Little 6 Click Score: 19    End of Session Equipment Utilized During Treatment: Gait belt Activity Tolerance: Patient limited by fatigue Patient left: in bed;with family/visitor present;with bed alarm set;with call bell/phone within reach         Time: 1405-1420 PT Time Calculation (min) (ACUTE ONLY): 15 min  Charges:  $Gait Training: 8-22 mins                    G Codes:       Chesley Noon, PTA 10/17/17, 2:32 PM

## 2017-10-18 LAB — BASIC METABOLIC PANEL
ANION GAP: 10 (ref 5–15)
BUN: 34 mg/dL — ABNORMAL HIGH (ref 6–20)
CALCIUM: 8.3 mg/dL — AB (ref 8.9–10.3)
CO2: 27 mmol/L (ref 22–32)
Chloride: 99 mmol/L — ABNORMAL LOW (ref 101–111)
Creatinine, Ser: 1.69 mg/dL — ABNORMAL HIGH (ref 0.61–1.24)
GFR calc Af Amer: 40 mL/min — ABNORMAL LOW (ref >60)
GFR, EST NON AFRICAN AMERICAN: 34 mL/min — AB (ref >60)
Glucose, Bld: 137 mg/dL — ABNORMAL HIGH (ref 65–99)
POTASSIUM: 4.1 mmol/L (ref 3.5–5.1)
SODIUM: 136 mmol/L (ref 135–145)

## 2017-10-18 MED ORDER — APIXABAN 2.5 MG PO TABS: 2.5000 mg | ORAL_TABLET | Freq: Two times a day (BID) | ORAL | 0 refills | Status: AC

## 2017-10-18 MED ORDER — DILTIAZEM HCL ER COATED BEADS 180 MG PO CP24: 180.0000 mg | ORAL_CAPSULE | Freq: Every day | ORAL | 0 refills | Status: DC

## 2017-10-18 MED ORDER — BISOPROLOL FUMARATE 5 MG PO TABS: 5.0000 mg | ORAL_TABLET | Freq: Every day | ORAL | 0 refills | Status: DC

## 2017-10-18 MED ORDER — TORSEMIDE 20 MG PO TABS: 20.0000 mg | ORAL_TABLET | Freq: Two times a day (BID) | ORAL | 0 refills | Status: DC

## 2017-10-18 NOTE — Discharge Summary (Signed)
[facility] NAME: Samuel Perry    MR#:  [id number]  DATE OF BIRTH:  09/27/1927  DATE OF ADMISSION:  10/14/2017 ADMITTING PHYSICIAN: Vaughan Basta, MD  DATE OF DISCHARGE: 10/18/2017  2:10 PM  PRIMARY CARE PHYSICIAN: Idelle Crouch, MD    ADMISSION DIAGNOSIS:  CHF (congestive heart failure) ([place]) [I50.9] Atrial fibrillation with RVR ([place]) [I48.91] Acute congestive heart failure, unspecified heart failure type ([place]) [I50.9]  DISCHARGE DIAGNOSIS:  Principal Problem:   Atrial fibrillation with RVR ([place]) Active Problems:   Acute systolic CHF (congestive heart failure) ([place])   Cardiomyopathy ([place])   SECONDARY DIAGNOSIS:   Past Medical History:  Diagnosis Date  . A-fib ([place])   . CHF (congestive heart failure) ([place])   . Coronary artery disease   . Hypertension     HOSPITAL COURSE:   1.  Acute on chronic systolic congestive heart failure with severe cardiomyopathy with an EF of 10-15% on outpatient echocardiogram.  When I saw the patient on Saturday I put the patient on Lasix 80 mg IV twice a day.  He received 3 doses of this and was feeling better and wanted to go home.  I will give torsemide 20 mg twice a day upon going home.  I started low-dose bisoprolol.  No ACE inhibitor or ARB or spironolactone secondary to increase in kidney function.  Follow-up at the CHF clinic as outpatient.  Patient was tapered off oxygen.  The patient wanted to get out of the hospital today and did not want to stay in the hospital any further.  The patient's baseline weight is 180-185 and patient's weight was 188 today. 2.  Atrial fibrillation with rapid ventricular response.  The patient was on amiodarone drip and then switched over to his usual amiodarone.  I had to increase his Cardizem CD to 180 mg daily.  I added low-dose bisoprolol at night.  Patient's Eliquis dose had to be decreased secondary to worsening kidney function.  Follow-up with Dr.  Nehemiah Massed as outpatient. 3.  Acute kidney injury on chronic kidney disease stage III.  Need to monitor closely as outpatient.  Creatinine when he came in was 1.42 and it went up to 1.81.  Upon discharge 1.69. 4.  Essential hypertension.  Continue to monitor with rate controlling medications 5.  History of coronary artery disease 6.  Hyperlipidemia unspecified on atorvastatin 7.  Glaucoma unspecified on timolol 8.  Neuropathy on gabapentin 9.  BPH on Flomax 10.  Weakness.  Physical therapy recommended home health  DISCHARGE CONDITIONS:   Satisfactory  CONSULTS OBTAINED:  Treatment Team:  Isaias Cowman, MD  DRUG ALLERGIES:  No Known Allergies  DISCHARGE MEDICATIONS:   Discharge Medication List as of 10/18/2017 12:54 PM    START taking these medications   Details  bisoprolol (ZEBETA) 5 MG tablet Take 1 tablet (5 mg total) by mouth at bedtime., Starting Sun 10/18/2017, Print    torsemide (DEMADEX) 20 MG tablet Take 1 tablet (20 mg total) by mouth 2 (two) times daily., Starting Sun 10/18/2017, Print      CONTINUE these medications which have CHANGED   Details  apixaban (ELIQUIS) 2.5 MG TABS tablet Take 1 tablet (2.5 mg total) by mouth 2 (two) times daily., Starting Sun 10/18/2017, Print    diltiazem (CARDIZEM CD) 180 MG 24 hr capsule Take 1 capsule (180 mg total) by mouth daily., Starting Mon 10/19/2017, Print      CONTINUE these medications which have NOT CHANGED  Details  amiodarone (PACERONE) 100 MG tablet Take 200 mg by mouth 2 (two) times daily., Historical Med    atorvastatin (LIPITOR) 80 MG tablet Take 80 mg by mouth daily., Historical Med    cyanocobalamin 1000 MCG tablet Take 1,000 mcg by mouth daily., Historical Med    fluticasone (FLONASE) 50 MCG/ACT nasal spray Place 2 sprays into both nostrils daily., Historical Med    gabapentin (NEURONTIN) 300 MG capsule Take 300 mg by mouth 3 (three) times daily., Historical Med    MAGNESIUM SULFATE PO Take 500 mg  by mouth as needed., Historical Med    meclizine (ANTIVERT) 25 MG tablet Take 25 mg by mouth as needed for dizziness., Historical Med    Multiple Vitamins-Minerals (MULTIVITAMIN WITH MINERALS) tablet Take 1 tablet by mouth daily., Historical Med    omeprazole (PRILOSEC) 40 MG capsule Take 40 mg by mouth daily., Historical Med    potassium chloride SA (K-DUR,KLOR-CON) 20 MEQ tablet Take 20 mEq by mouth 2 (two) times daily., Historical Med    tamsulosin (FLOMAX) 0.4 MG CAPS capsule Take 0.4 mg by mouth 2 (two) times daily. 30 minutes after same daily meal, Historical Med    timolol (BETIMOL) 0.5 % ophthalmic solution Place 1 drop into both eyes daily., Historical Med    traMADol (ULTRAM) 50 MG tablet Take 50 mg by mouth every 6 (six) hours as needed., Historical Med         DISCHARGE INSTRUCTIONS:   Follow-up PMD 1 week Follow-up cardiology 1 week Follow-up CHF clinic  If you experience worsening of your admission symptoms, develop shortness of breath, life threatening emergency, suicidal or homicidal thoughts you must seek medical attention immediately by calling 911 or calling your MD immediately  if symptoms less severe.  You Must read complete instructions/literature along with all the possible adverse reactions/side effects for all the Medicines you take and that have been prescribed to you. Take any new Medicines after you have completely understood and accept all the possible adverse reactions/side effects.   Please note  You were cared for by a hospitalist during your hospital stay. If you have any questions about your discharge medications or the care you received while you were in the hospital after you are discharged, you can call the unit and asked to speak with the hospitalist on call if the hospitalist that took care of you is not available. Once you are discharged, your primary care physician will handle any further medical issues. Please note that NO REFILLS for any  discharge medications will be authorized once you are discharged, as it is imperative that you return to your primary care physician (or establish a relationship with a primary care physician if you do not have one) for your aftercare needs so that they can reassess your need for medications and monitor your lab values.    Today   CHIEF COMPLAINT:   Chief Complaint  Patient presents with  . Shortness of Breath    HISTORY OF PRESENT ILLNESS:  Samuel Perry  is a 83 y.o. male came in with shortness of breath   VITAL SIGNS:  Blood pressure 103/78, pulse 71, temperature 97.6 F (36.4 C), temperature source Oral, resp. rate 18, height 5\' 11"  (1.803 m), weight 85.3 kg (188 lb), SpO2 92 %.  Heart rate  PHYSICAL EXAMINATION:  GENERAL:  83 y.o.-year-old patient lying in the bed with no acute distress.  EYES: Pupils equal, round, reactive to light and accommodation. No scleral icterus. Extraocular muscles intact.  HEENT: Head atraumatic, normocephalic. Oropharynx and nasopharynx clear.  NECK:  Supple, no jugular venous distention. No thyroid enlargement, no tenderness.  LUNGS: Normal breath sounds bilaterally, no wheezing, rales,rhonchi or crepitation. No use of accessory muscles of respiration.  CARDIOVASCULAR: S1, S2 normal. No murmurs, rubs, or gallops.  ABDOMEN: Soft, non-tender, non-distended. Bowel sounds present. No organomegaly or mass.  EXTREMITIES: No pedal edema, cyanosis, or clubbing.  NEUROLOGIC: Cranial nerves II through XII are intact. Muscle strength 5/5 in all extremities. Sensation intact. Gait not checked.  PSYCHIATRIC: The patient is alert and oriented x 3.  SKIN: No obvious rash, lesion, or ulcer.   DATA REVIEW:   CBC  Recent Labs Lab 10/16/17 0331  WBC 8.1  HGB 13.6  HCT 40.8  PLT 124*    Chemistries   Recent Labs Lab 10/14/17 1249  10/18/17 0426  NA 134*  < > 136  K 4.0  < > 4.1  CL 99*  < > 99*  CO2 23  < > 27  GLUCOSE 183*  < > 137*  BUN 30*  <  > 34*  CREATININE 1.42*  < > 1.69*  CALCIUM 9.3  < > 8.3*  AST 55*  --   --   ALT 78*  --   --   ALKPHOS 99  --   --   BILITOT 1.1  --   --   < > = values in this interval not displayed.  Cardiac Enzymes  Recent Labs Lab 10/14/17 2339  TROPONINI <0.03      RADIOLOGY:  Dg Chest 1 View  Result Date: 10/16/2017 CLINICAL DATA:  Increased wheezing and shortness of breath EXAM: CHEST 1 VIEW COMPARISON:  10/15/2017, 10/14/2017 FINDINGS: Mild cardiomegaly. Similar appearance of vascular congestion and hazy bibasilar opacity. Small pleural effusions. Aortic atherosclerosis. No pneumothorax. IMPRESSION: Continued cardiomegaly, small pleural effusions, vascular congestion and pulmonary edema without much interval change. Mild bibasilar atelectasis or infiltrates. Electronically Signed   By: Donavan Foil M.D.   On: 10/16/2017 19:31      Management plans discussed with the patient, family and they are in agreement.  CODE STATUS:     Code Status Orders        Start     Ordered   10/14/17 1551  Do not attempt resuscitation (DNR)  Continuous    Question Answer Comment  In the event of cardiac or respiratory ARREST Do not call a "code blue"   In the event of cardiac or respiratory ARREST Do not perform Intubation, CPR, defibrillation or ACLS   In the event of cardiac or respiratory ARREST Use medication by any route, position, wound care, and other measures to relive pain and suffering. May use oxygen, suction and manual treatment of airway obstruction as needed for comfort.      10/14/17 1550    Code Status History    Date Active Date Inactive Code Status Order ID Comments User Context   This patient has a current code status but no historical code status.    Advance Directive Documentation     Most Recent Value  Type of Advance Directive  Healthcare Power of Attorney  Pre-existing out of facility DNR order (yellow form or pink MOST form)  -  "MOST" Form in Place?  -       TOTAL TIME TAKING CARE OF THIS PATIENT: 35 minutes.    Loletha Grayer M.D on 10/18/2017 at 3:42 PM  Between 7am to 6pm - Pager - [phone]  After  6pm go to [email or web address] - password [email or web address] [facility]  [phone]  CC: Primary care physician; Idelle Crouch, MD

## 2017-10-18 NOTE — Care Management Note (Signed)
Case Management Note  Patient Details  Name: FARUQ ROSENBERGER MRN: [id number] Date of Birth: 07-10-1927  Subjective/Objective:     Discussed discharge planning with Mrs Mccorkel. This Probation officer was unablke to find a local home health provider who would accept [employer]. [facility] agreed to provide HH=PT and Aide, but not RN services. Mr Vossler has been referred to [facility] so he will be seen by a [facility] nurse. Jermaine at [facility] will provide a RW to [facility] room today.                Action/Plan:   Expected Discharge Date:  10/18/17               Expected Discharge Plan:     In-House Referral:  [facility]  Discharge planning Services  HF Clinic  Post Acute Care Choice:    Choice offered to:     DME Arranged:    DME Agency:     HH Arranged:    HH Agency:     Status of Service:  In process, will continue to follow  If discussed at Long Length of Stay Meetings, dates discussed:    Additional Comments:  Lillar Bianca A, RN 10/18/2017, 11:36 AM

## 2017-10-18 NOTE — Progress Notes (Signed)
Pt ambulated with walker in hallway 75 feet. Heart rate at rest 117. With exertion went to 137. o2 sats 97 on room air at rest. With exertion on room air sats were 93%. Pt felt tired afterward, but his gait remained steady and pace was good. Dr. Leslye Peer made aware of this

## 2017-10-18 NOTE — Discharge Instructions (Signed)
Atrial Fibrillation Atrial fibrillation is a type of irregular or rapid heartbeat (arrhythmia). In atrial fibrillation, the heart quivers continuously in a chaotic pattern. This occurs when parts of the heart receive disorganized signals that make the heart unable to pump blood normally. This can increase the risk for stroke, heart failure, and other heart-related conditions. There are different types of atrial fibrillation, including:  Paroxysmal atrial fibrillation. This type starts suddenly, and it usually stops on its own shortly after it starts.  Persistent atrial fibrillation. This type often lasts longer than a week. It may stop on its own or with treatment.  Long-lasting persistent atrial fibrillation. This type lasts longer than 12 months.  Permanent atrial fibrillation. This type does not go away.  Talk with your health care provider to learn about the type of atrial fibrillation that you have. What are the causes? This condition is caused by some heart-related conditions or procedures, including:  A heart attack.  Coronary artery disease.  Heart failure.  Heart valve conditions.  High blood pressure.  Inflammation of the sac that surrounds the heart (pericarditis).  Heart surgery.  Certain heart rhythm disorders, such as Wolf-Parkinson-White syndrome.  Other causes include:  Pneumonia.  Obstructive sleep apnea.  Blockage of an artery in the lungs (pulmonary embolism, or PE).  Lung cancer.  Chronic lung disease.  Thyroid problems, especially if the thyroid is overactive (hyperthyroidism).  Caffeine.  Excessive alcohol use or illegal drug use.  Use of some medicines, including certain decongestants and diet pills.  Sometimes, the cause cannot be found. What increases the risk? This condition is more likely to develop in:  People who are older in age.  People who smoke.  People who have diabetes mellitus.  People who are overweight  (obese).  Athletes who exercise vigorously.  What are the signs or symptoms? Symptoms of this condition include:  A feeling that your heart is beating rapidly or irregularly.  A feeling of discomfort or pain in your chest.  Shortness of breath.  Sudden light-headedness or weakness.  Getting tired easily during exercise.  In some cases, there are no symptoms. How is this diagnosed? Your health care provider may be able to detect atrial fibrillation when taking your pulse. If detected, this condition may be diagnosed with:  An electrocardiogram (ECG).  A Holter monitor test that records your heartbeat patterns over a 24-hour period.  Transthoracic echocardiogram (TTE) to evaluate how blood flows through your heart.  Transesophageal echocardiogram (TEE) to view more detailed images of your heart.  A stress test.  Imaging tests, such as a CT scan or chest X-ray.  Blood tests.  How is this treated? The main goals of treatment are to prevent blood clots from forming and to keep your heart beating at a normal rate and rhythm. The type of treatment that you receive depends on many factors, such as your underlying medical conditions and how you feel when you are experiencing atrial fibrillation. This condition may be treated with:  Medicine to slow down the heart rate, bring the hearts rhythm back to normal, or prevent clots from forming.  Electrical cardioversion. This is a procedure that resets your hearts rhythm by delivering a controlled, low-energy shock to the heart through your skin.  Different types of ablation, such as catheter ablation, catheter ablation with pacemaker, or surgical ablation. These procedures destroy the heart tissues that send abnormal signals. When the pacemaker is used, it is placed under your skin to help your heart beat in  a regular rhythm.  Follow these instructions at home:  Take over-the counter and prescription medicines only as told by your  health care provider.  If your health care provider prescribed a blood-thinning medicine (anticoagulant), take it exactly as told. Taking too much blood-thinning medicine can cause bleeding. If you do not take enough blood-thinning medicine, you will not have the protection that you need against stroke and other problems.  Do not use tobacco products, including cigarettes, chewing tobacco, and e-cigarettes. If you need help quitting, ask your health care provider.  If you have obstructive sleep apnea, manage your condition as told by your health care provider.  Do not drink alcohol.  Do not drink beverages that contain caffeine, such as coffee, soda, and tea.  Maintain a healthy weight. Do not use diet pills unless your health care provider approves. Diet pills may make heart problems worse.  Follow diet instructions as told by your health care provider.  Exercise regularly as told by your health care provider.  Keep all follow-up visits as told by your health care provider. This is important. How is this prevented?  Avoid drinking beverages that contain caffeine or alcohol.  Avoid certain medicines, especially medicines that are used for breathing problems.  Avoid certain herbs and herbal medicines, such as those that contain ephedra or ginseng.  Do not use illegal drugs, such as cocaine and amphetamines.  Do not smoke.  Manage your high blood pressure. Contact a health care provider if:  You notice a change in the rate, rhythm, or strength of your heartbeat.  You are taking an anticoagulant and you notice increased bruising.  You tire more easily when you exercise or exert yourself. Get help right away if:  You have chest pain, abdominal pain, sweating, or weakness.  You feel nauseous.  You notice blood in your vomit, bowel movement, or urine.  You have shortness of breath.  You suddenly have swollen feet and ankles.  You feel dizzy.  You have sudden weakness or  numbness of the face, arm, or leg, especially on one side of the body.  You have trouble speaking, trouble understanding, or both (aphasia).  Your face or your eyelid droops on one side. These symptoms may represent a serious problem that is an emergency. Do not wait to see if the symptoms will go away. Get medical help right away. Call your local emergency services (911 in the [place] Do not drive yourself to the hospital. This information is not intended to replace advice given to you by your health care provider. Make sure you discuss any questions you have with your health care provider. Document Released: 12/08/2005 Document Revised: 04/16/2016 Document Reviewed: 04/04/2015 Elsevier Interactive Patient Education  2017 [facility] appointment on October 22 2017 at 9:20am with Darylene Price, [place]. Please call [phone] to reschedule.

## 2017-10-18 NOTE — Progress Notes (Signed)
Pt to be discharged to home today. Iv's removed tele d/c'd. disch instructions and prescrips given. Rolling walker provided by home health. disch via w.c. Accompanied by family.

## 2017-10-19 ENCOUNTER — Other Ambulatory Visit: Payer: Self-pay | Admitting: *Deleted

## 2017-10-19 DIAGNOSIS — E785 Hyperlipidemia, unspecified: Secondary | ICD-10-CM | POA: Diagnosis not present

## 2017-10-19 DIAGNOSIS — I5021 Acute systolic (congestive) heart failure: Secondary | ICD-10-CM | POA: Diagnosis not present

## 2017-10-19 DIAGNOSIS — I429 Cardiomyopathy, unspecified: Secondary | ICD-10-CM | POA: Diagnosis not present

## 2017-10-19 DIAGNOSIS — I480 Paroxysmal atrial fibrillation: Secondary | ICD-10-CM | POA: Diagnosis not present

## 2017-10-19 DIAGNOSIS — I110 Hypertensive heart disease with heart failure: Secondary | ICD-10-CM | POA: Diagnosis not present

## 2017-10-19 DIAGNOSIS — I2510 Atherosclerotic heart disease of native coronary artery without angina pectoris: Secondary | ICD-10-CM | POA: Diagnosis not present

## 2017-10-19 NOTE — Patient Outreach (Signed)
Successful telephone encounter to Derrin Currey, 83 year old male, follow up on referral received today from [facility] hospital liaison RN for Community [facility] services/transition of care/recent hospitalization October 24-28,2018 for CHF,Atrial fib. Verbal consent obtained from pt for [facility] services.   Pt 's history includes but not limited to CAD, Hypertension.  Spoke with pt who provided permission to speak with spouse, HIPAA identifiers provided on pt, discussed purpose of call- follow up on referral following recent hospital discharge. Spouse reports on pt's recent hospitalization,result of fluid, fluid not gone yet, still has some in stomach/chest/has a cough. Spouse reports  Pt is using a walker as legs are weak.  Spouse reports pt is weighing every day, recording, weight yesterday and today 188 lbs. RN CM discussed with spouse calling MD for weight gain of 2-3 lbs overnight/5 lbs in a week to which voiced understanding.  Spouse reports pt has sob with exertion, slept good last night, taking all of his medications per discharge paper/she is administering pt's medications at this time. ** Patient was recently discharged from hospital and all medications have been reviewed.  Spouse reports HH PT came today, follow up MD appointments made.  RN CM reviewed  with spouse [facility] transition of care program - weekly phone calls to pt (31 days), a home visit - no cost.     Plan:  As discussed with spouse, plan to follow up again with pt next week- initial home visit.            Plan to inform Dr. Doy Hutching of [facility] involvement- send [place] letter.   Zara Chess.   [facility]  [phone]

## 2017-10-20 ENCOUNTER — Encounter: Payer: Self-pay | Admitting: *Deleted

## 2017-10-20 DIAGNOSIS — I5021 Acute systolic (congestive) heart failure: Secondary | ICD-10-CM | POA: Diagnosis not present

## 2017-10-20 DIAGNOSIS — I480 Paroxysmal atrial fibrillation: Secondary | ICD-10-CM | POA: Diagnosis not present

## 2017-10-20 DIAGNOSIS — I10 Essential (primary) hypertension: Secondary | ICD-10-CM | POA: Diagnosis not present

## 2017-10-20 DIAGNOSIS — I509 Heart failure, unspecified: Secondary | ICD-10-CM | POA: Diagnosis not present

## 2017-10-20 DIAGNOSIS — R0602 Shortness of breath: Secondary | ICD-10-CM | POA: Diagnosis not present

## 2017-10-20 DIAGNOSIS — Z79899 Other long term (current) drug therapy: Secondary | ICD-10-CM | POA: Diagnosis not present

## 2017-10-20 DIAGNOSIS — E782 Mixed hyperlipidemia: Secondary | ICD-10-CM | POA: Diagnosis not present

## 2017-10-22 ENCOUNTER — Encounter: Payer: Self-pay | Admitting: Family

## 2017-10-22 ENCOUNTER — Ambulatory Visit: Payer: PPO | Attending: Family | Admitting: Family

## 2017-10-22 VITALS — BP 109/62 | HR 88 | Resp 18 | Ht 71.0 in | Wt 192.4 lb

## 2017-10-22 DIAGNOSIS — I5021 Acute systolic (congestive) heart failure: Secondary | ICD-10-CM | POA: Diagnosis not present

## 2017-10-22 DIAGNOSIS — I890 Lymphedema, not elsewhere classified: Secondary | ICD-10-CM | POA: Diagnosis not present

## 2017-10-22 DIAGNOSIS — I10 Essential (primary) hypertension: Secondary | ICD-10-CM | POA: Insufficient documentation

## 2017-10-22 DIAGNOSIS — N183 Chronic kidney disease, stage 3 unspecified: Secondary | ICD-10-CM | POA: Insufficient documentation

## 2017-10-22 DIAGNOSIS — I4891 Unspecified atrial fibrillation: Secondary | ICD-10-CM | POA: Diagnosis not present

## 2017-10-22 DIAGNOSIS — Z79899 Other long term (current) drug therapy: Secondary | ICD-10-CM | POA: Diagnosis not present

## 2017-10-22 DIAGNOSIS — N189 Chronic kidney disease, unspecified: Secondary | ICD-10-CM | POA: Insufficient documentation

## 2017-10-22 DIAGNOSIS — Z7901 Long term (current) use of anticoagulants: Secondary | ICD-10-CM | POA: Insufficient documentation

## 2017-10-22 DIAGNOSIS — I2510 Atherosclerotic heart disease of native coronary artery without angina pectoris: Secondary | ICD-10-CM | POA: Diagnosis not present

## 2017-10-22 DIAGNOSIS — I480 Paroxysmal atrial fibrillation: Secondary | ICD-10-CM | POA: Diagnosis not present

## 2017-10-22 DIAGNOSIS — I5022 Chronic systolic (congestive) heart failure: Secondary | ICD-10-CM | POA: Insufficient documentation

## 2017-10-22 DIAGNOSIS — I130 Hypertensive heart and chronic kidney disease with heart failure and stage 1 through stage 4 chronic kidney disease, or unspecified chronic kidney disease: Secondary | ICD-10-CM | POA: Insufficient documentation

## 2017-10-22 NOTE — Progress Notes (Signed)
Patient ID: Samuel Perry, male    DOB: [birth year], 83 y.o.   MRN: [id number]  HPI  Samuel Perry is a 83 y/o male with a history of atrial fibrillation, CAD, HTN, CKD and chronic heart failure.   Echo done 10/13/17 reviewed and shows an EF of 15% along with moderate Samuel/TR.   Admitted 10/14/17 due to HF and AF/RVR. Initially needed IV diuretics and then transitioned to oral diuretics. Cardiology consult was obtained. Amiodarone drip was started and then switched back to his usual oral amiodarone dose. Discharged home after 4 days.   Patient presents for his initial visit with a chief complaint of edema in his lower legs. He describes this as occurring over the last couple of weeks and has been worsening since discharge from the hospital. He has associated fatigue, cough, shortness of breath, abdominal distention, light-headedness and weight gain. He denies any chest pain, palpitations or difficulty sleeping.   Past Medical History:  Diagnosis Date  . A-fib ([place])   . CHF (congestive heart failure) ([place])   . Coronary artery disease   . Hypertension    No past surgical history on file. Family History  Problem Relation Age of Onset  . CAD Mother   . CAD Brother    Social History  Substance Use Topics  . Smoking status: Never Smoker  . Smokeless tobacco: Never Used  . Alcohol use No   No Known Allergies Prior to Admission medications   Medication Sig Start Date End Date Taking? Authorizing Provider  amiodarone (PACERONE) 100 MG tablet Take 200 mg by mouth 2 (two) times daily.   Yes [provider]  apixaban (ELIQUIS) 2.5 MG TABS tablet Take 1 tablet (2.5 mg total) by mouth 2 (two) times daily. 10/18/17  Yes Wieting, Richard, MD  atorvastatin (LIPITOR) 80 MG tablet Take 80 mg by mouth daily.   Yes [provider]  bisoprolol (ZEBETA) 5 MG tablet Take 1 tablet (5 mg total) by mouth at bedtime. 10/18/17  Yes Wieting, Richard, MD  cyanocobalamin 1000 MCG tablet Take 1,000  mcg by mouth daily.   Yes [provider]  diltiazem (CARDIZEM CD) 180 MG 24 hr capsule Take 1 capsule (180 mg total) by mouth daily. 10/19/17  Yes Wieting, Richard, MD  fluticasone (FLONASE) 50 MCG/ACT nasal spray Place 2 sprays into both nostrils daily.   Yes [provider]  gabapentin (NEURONTIN) 300 MG capsule Take 300 mg by mouth 3 (three) times daily.   Yes [provider]  MAGNESIUM SULFATE PO Take 500 mg by mouth as needed.   Yes [provider]  Multiple Vitamins-Minerals (MULTIVITAMIN WITH MINERALS) tablet Take 1 tablet by mouth daily.   Yes [provider]  omeprazole (PRILOSEC) 40 MG capsule Take 40 mg by mouth daily.   Yes [provider]  potassium chloride SA (K-DUR,KLOR-CON) 20 MEQ tablet Take 20 mEq by mouth 2 (two) times daily.   Yes [provider]  tamsulosin (FLOMAX) 0.4 MG CAPS capsule Take 0.4 mg by mouth 2 (two) times daily. 30 minutes after same daily meal   Yes [provider]  timolol (BETIMOL) 0.5 % ophthalmic solution Place 1 drop into both eyes daily.   Yes [provider]  torsemide (DEMADEX) 20 MG tablet Take 1 tablet (20 mg total) by mouth 2 (two) times daily. 10/18/17  Yes Wieting, Richard, MD  traMADol (ULTRAM) 50 MG tablet Take 50 mg by mouth every 6 (six) hours as needed.   Yes  [provider]   Review of Systems  Constitutional: Positive for fatigue. Negative for appetite change.  HENT: Negative for congestion, postnasal drip and sore throat.   Eyes: Negative.   Respiratory: Positive for cough and shortness of breath. Negative for chest tightness.   Cardiovascular: Positive for leg swelling. Negative for chest pain and palpitations.  Gastrointestinal: Positive for abdominal distention. Negative for abdominal pain.  Endocrine: Negative.   Genitourinary: Negative.   Musculoskeletal: Negative.   Skin: Negative.   Allergic/Immunologic: Negative.   Neurological:  Positive for light-headedness. Negative for dizziness.  Hematological: Negative for adenopathy. Bruises/bleeds easily.  Psychiatric/Behavioral: Negative for dysphoric mood and sleep disturbance (sleeping well, + snoring). The patient is not nervous/anxious.    Vitals:   10/22/17 0913 10/22/17 1000  BP: 109/62   Pulse: (!) 108 88  Resp: 18   SpO2: 94%   Weight: 192 lb 6 oz (87.3 kg)   Height: 5\' 11"  (1.803 m)    Wt Readings from Last 3 Encounters:  10/22/17 192 lb 6 oz (87.3 kg)  10/18/17 188 lb (85.3 kg)   Lab Results  Component Value Date   CREATININE 1.69 (H) 10/18/2017   CREATININE 1.77 (H) 10/17/2017   CREATININE 1.81 (H) 10/16/2017    Physical Exam  Constitutional: He is oriented to person, place, and time. He appears well-developed and well-nourished.  HENT:  Head: Normocephalic and atraumatic.  Neck: Normal range of motion. Neck supple. No JVD present.  Cardiovascular: Normal rate.  An irregular rhythm present.  Pulmonary/Chest: Effort normal. He has no wheezes. He has no rales.  Abdominal: Soft. He exhibits distension. There is no tenderness.  Musculoskeletal: He exhibits edema (3+ pitting edema in bilateral lower legs). He exhibits no tenderness.  Neurological: He is alert and oriented to person, place, and time.  Skin: Skin is warm and dry.  Psychiatric: He has a normal mood and affect. His behavior is normal. Thought content normal.  Nursing note and vitals reviewed.   Assessment & Plan:  1: Acute heart failure with reduced ejection fraction- - NYHA class III - moderately fluid overloaded today - weighing daily at home and says that his weight has gradually risen since hospital discharge. Instructed to call for an overnight weight gain of >2 pounds or a weekly weight gain of >5 pounds - not adding salt but they haven't been reading food labels. Reviewed how to read food labels and the importance of closely following a 2000mg  sodium diet. Written dietary  information was given to them as well about this. - recommend stopping diltiazem (not indicated with decreased EF) and titrating up bisoprolol to target dose - recommend 2.5mg  metolazone daily for 3 days with additional potassium for those 3 days as well - since they are seeing their cardiologist today, they would like to discuss above recommendations with him. They will call back if they need me to call in the metolazone - could consider entresto depending on renal function - saw cardiologist Nehemiah Massed) 10/13/17 & returns today - patient reports receiving his flu vaccine for this season already  2: Atrial fibrillation- - HR irregular (88-108) - currently on amiodarone, diltiazem and bisoprolol along with apixaban  3: HTN- - BP looks good today - saw PCP Doy Hutching) 10/20/17  4: CKD- - wife & patient aware that if above metolazone is used, that his renal function could decline short-term.  - BMP from 10/18/17 reviewed and shows sodium 136, potassium 4.1 and GFR 34 - BMP from 10/20/17 reviewed and shows  sodium 141, potassium 3.3 and GFR 36 - scheduled for repeat lab work on 10/27/17  5: Lymphedema- - stage 2 - hasn't worn TED hose and he was encouraged to get a pair of support hose and start wearing them daily with removal of them at bedtime - wife has on a pair so knows where to get them from - also elevate his legs when he's sitting for long periods of time - discussed using lymphapress compression boots if edema persists; brochure on this was given to patient  Patient did not bring his medications nor a list. Each medication was verbally reviewed with the patient and he was encouraged to bring the bottles to every visit to confirm accuracy of list.  Return in 1 week or sooner for any questions/problems before then.

## 2017-10-22 NOTE — Patient Instructions (Addendum)
Continue weighing daily and call for an overnight weight gain of > 2 pounds or a weekly weight gain of >5 pounds.  Continue potassium tablets twice daily  May consider stopping cardizem and increasing bisoprolol as needed.  Would recommend 2.5mg  metolazone daily for 3 days with an additional 60meq potassium with the metolazone.   Get support hose to wear.

## 2017-10-26 ENCOUNTER — Other Ambulatory Visit: Payer: Self-pay

## 2017-10-26 ENCOUNTER — Inpatient Hospital Stay
Admission: EM | Admit: 2017-10-26 | Discharge: 2017-10-29 | DRG: 291 | Disposition: A | Discharge status: Home Health | Payer: PPO | Attending: Internal Medicine | Admitting: Internal Medicine

## 2017-10-26 ENCOUNTER — Encounter: Payer: Self-pay | Admitting: Emergency Medicine

## 2017-10-26 ENCOUNTER — Emergency Department: Payer: PPO

## 2017-10-26 DIAGNOSIS — I130 Hypertensive heart and chronic kidney disease with heart failure and stage 1 through stage 4 chronic kidney disease, or unspecified chronic kidney disease: Principal | ICD-10-CM | POA: Diagnosis present

## 2017-10-26 DIAGNOSIS — N183 Chronic kidney disease, stage 3 (moderate): Secondary | ICD-10-CM | POA: Diagnosis not present

## 2017-10-26 DIAGNOSIS — I129 Hypertensive chronic kidney disease with stage 1 through stage 4 chronic kidney disease, or unspecified chronic kidney disease: Secondary | ICD-10-CM | POA: Diagnosis not present

## 2017-10-26 DIAGNOSIS — I509 Heart failure, unspecified: Secondary | ICD-10-CM | POA: Diagnosis not present

## 2017-10-26 DIAGNOSIS — Z66 Do not resuscitate: Secondary | ICD-10-CM | POA: Diagnosis not present

## 2017-10-26 DIAGNOSIS — R402412 Glasgow coma scale score 13-15, at arrival to emergency department: Secondary | ICD-10-CM | POA: Diagnosis present

## 2017-10-26 DIAGNOSIS — I4891 Unspecified atrial fibrillation: Secondary | ICD-10-CM | POA: Diagnosis not present

## 2017-10-26 DIAGNOSIS — E876 Hypokalemia: Secondary | ICD-10-CM | POA: Diagnosis not present

## 2017-10-26 DIAGNOSIS — I2510 Atherosclerotic heart disease of native coronary artery without angina pectoris: Secondary | ICD-10-CM | POA: Diagnosis present

## 2017-10-26 DIAGNOSIS — Z7901 Long term (current) use of anticoagulants: Secondary | ICD-10-CM | POA: Diagnosis not present

## 2017-10-26 DIAGNOSIS — R0609 Other forms of dyspnea: Secondary | ICD-10-CM

## 2017-10-26 DIAGNOSIS — I5023 Acute on chronic systolic (congestive) heart failure: Secondary | ICD-10-CM | POA: Diagnosis present

## 2017-10-26 DIAGNOSIS — N179 Acute kidney failure, unspecified: Secondary | ICD-10-CM | POA: Diagnosis not present

## 2017-10-26 DIAGNOSIS — R001 Bradycardia, unspecified: Secondary | ICD-10-CM | POA: Diagnosis present

## 2017-10-26 DIAGNOSIS — Z79899 Other long term (current) drug therapy: Secondary | ICD-10-CM

## 2017-10-26 DIAGNOSIS — I480 Paroxysmal atrial fibrillation: Secondary | ICD-10-CM | POA: Diagnosis not present

## 2017-10-26 DIAGNOSIS — N400 Enlarged prostate without lower urinary tract symptoms: Secondary | ICD-10-CM | POA: Diagnosis not present

## 2017-10-26 DIAGNOSIS — R601 Generalized edema: Secondary | ICD-10-CM | POA: Diagnosis not present

## 2017-10-26 DIAGNOSIS — I482 Chronic atrial fibrillation: Secondary | ICD-10-CM | POA: Diagnosis not present

## 2017-10-26 DIAGNOSIS — R600 Localized edema: Secondary | ICD-10-CM | POA: Diagnosis not present

## 2017-10-26 DIAGNOSIS — R0602 Shortness of breath: Secondary | ICD-10-CM | POA: Diagnosis not present

## 2017-10-26 DIAGNOSIS — I5021 Acute systolic (congestive) heart failure: Secondary | ICD-10-CM | POA: Diagnosis not present

## 2017-10-26 DIAGNOSIS — E785 Hyperlipidemia, unspecified: Secondary | ICD-10-CM | POA: Diagnosis not present

## 2017-10-26 HISTORY — DX: Chronic kidney disease, stage 2 (mild): N18.2

## 2017-10-26 LAB — COMPREHENSIVE METABOLIC PANEL
ALBUMIN: 3.6 g/dL (ref 3.5–5.0)
ALK PHOS: 96 U/L (ref 38–126)
ALT: 51 U/L (ref 17–63)
AST: 46 U/L — AB (ref 15–41)
Anion gap: 11 (ref 5–15)
BILIRUBIN TOTAL: 1.2 mg/dL (ref 0.3–1.2)
BUN: 44 mg/dL — AB (ref 6–20)
CALCIUM: 9.2 mg/dL (ref 8.9–10.3)
CO2: 28 mmol/L (ref 22–32)
Chloride: 98 mmol/L — ABNORMAL LOW (ref 101–111)
Creatinine, Ser: 2.41 mg/dL — ABNORMAL HIGH (ref 0.61–1.24)
GFR calc Af Amer: 26 mL/min — ABNORMAL LOW (ref >60)
GFR calc non Af Amer: 22 mL/min — ABNORMAL LOW (ref >60)
GLUCOSE: 151 mg/dL — AB (ref 65–99)
Potassium: 3.2 mmol/L — ABNORMAL LOW (ref 3.5–5.1)
SODIUM: 137 mmol/L (ref 135–145)
TOTAL PROTEIN: 6.1 g/dL — AB (ref 6.5–8.1)

## 2017-10-26 LAB — CBC WITH DIFFERENTIAL/PLATELET
BASOS ABS: 0.1 10*3/uL (ref 0–0.1)
BASOS PCT: 1 %
EOS ABS: 0.1 10*3/uL (ref 0–0.7)
Eosinophils Relative: 1 %
HEMATOCRIT: 43.5 % (ref 40.0–52.0)
HEMOGLOBIN: 14.4 g/dL (ref 13.0–18.0)
Lymphocytes Relative: 18 %
Lymphs Abs: 1.6 10*3/uL (ref 1.0–3.6)
MCH: 32.7 pg (ref 26.0–34.0)
MCHC: 33 g/dL (ref 32.0–36.0)
MCV: 99.1 fL (ref 80.0–100.0)
MONOS PCT: 9 %
Monocytes Absolute: 0.8 10*3/uL (ref 0.2–1.0)
NEUTROS ABS: 6.4 10*3/uL (ref 1.4–6.5)
NEUTROS PCT: 71 %
Platelets: 189 10*3/uL (ref 150–440)
RBC: 4.39 MIL/uL — AB (ref 4.40–5.90)
RDW: 14.3 % (ref 11.5–14.5)
WBC: 8.9 10*3/uL (ref 3.8–10.6)

## 2017-10-26 LAB — URINALYSIS, COMPLETE (UACMP) WITH MICROSCOPIC
BACTERIA UA: NONE SEEN
Bilirubin Urine: NEGATIVE
Glucose, UA: NEGATIVE mg/dL
Hgb urine dipstick: NEGATIVE
KETONES UR: NEGATIVE mg/dL
LEUKOCYTES UA: NEGATIVE
Nitrite: NEGATIVE
PH: 5.5 (ref 5.0–8.0)
PROTEIN: NEGATIVE mg/dL
Specific Gravity, Urine: 1.01 (ref 1.005–1.030)

## 2017-10-26 LAB — MAGNESIUM: MAGNESIUM: 2.2 mg/dL (ref 1.7–2.4)

## 2017-10-26 LAB — TROPONIN I: Troponin I: <0.03 ng/mL (ref <0.03)

## 2017-10-26 LAB — BRAIN NATRIURETIC PEPTIDE: B Natriuretic Peptide: 2688 pg/mL — ABNORMAL HIGH (ref 0.0–100.0)

## 2017-10-26 MED ORDER — POTASSIUM CHLORIDE CRYS ER 20 MEQ PO TBCR
20.0000 meq | EXTENDED_RELEASE_TABLET | Freq: Two times a day (BID) | ORAL | Status: DC
  Administered 2017-10-27: 20 meq via ORAL
  Filled 2017-10-26: qty 1

## 2017-10-26 MED ORDER — SODIUM CHLORIDE 0.9 % IV SOLN: 250.0000 mL | INTRAVENOUS | Status: DC | PRN

## 2017-10-26 MED ORDER — FUROSEMIDE 10 MG/ML IJ SOLN
40.0000 mg | Freq: Two times a day (BID) | INTRAMUSCULAR | Status: DC
  Administered 2017-10-27 – 2017-10-28 (×3): 40 mg via INTRAVENOUS
  Filled 2017-10-26 (×3): qty 4

## 2017-10-26 MED ORDER — TIMOLOL HEMIHYDRATE 0.5 % OP SOLN
1.0000 [drp] | Freq: Every day | OPHTHALMIC | Status: DC
  Administered 2017-10-27 – 2017-10-29 (×3): 1 [drp] via OPHTHALMIC
  Filled 2017-10-26: qty 5

## 2017-10-26 MED ORDER — DIGOXIN 125 MCG PO TABS
0.1250 mg | ORAL_TABLET | Freq: Every day | ORAL | Status: DC
  Administered 2017-10-26 – 2017-10-29 (×3): 0.125 mg via ORAL
  Filled 2017-10-26 (×5): qty 1

## 2017-10-26 MED ORDER — FUROSEMIDE 10 MG/ML IJ SOLN: 80.0000 mg | Freq: Once | INTRAMUSCULAR | Status: DC

## 2017-10-26 MED ORDER — TAMSULOSIN HCL 0.4 MG PO CAPS
0.4000 mg | ORAL_CAPSULE | Freq: Every day | ORAL | Status: DC
  Administered 2017-10-26 – 2017-10-28 (×3): 0.4 mg via ORAL
  Filled 2017-10-26 (×3): qty 1

## 2017-10-26 MED ORDER — DILTIAZEM HCL ER COATED BEADS 180 MG PO CP24
180.0000 mg | ORAL_CAPSULE | Freq: Every day | ORAL | Status: DC
  Administered 2017-10-28: 180 mg via ORAL
  Filled 2017-10-26 (×3): qty 1

## 2017-10-26 MED ORDER — TRAMADOL HCL 50 MG PO TABS: 50.0000 mg | ORAL_TABLET | Freq: Four times a day (QID) | ORAL | Status: DC | PRN

## 2017-10-26 MED ORDER — APIXABAN 2.5 MG PO TABS
2.5000 mg | ORAL_TABLET | Freq: Two times a day (BID) | ORAL | Status: DC
  Administered 2017-10-26 – 2017-10-29 (×6): 2.5 mg via ORAL
  Filled 2017-10-26 (×6): qty 1

## 2017-10-26 MED ORDER — AMIODARONE HCL 200 MG PO TABS
200.0000 mg | ORAL_TABLET | Freq: Two times a day (BID) | ORAL | Status: DC
  Administered 2017-10-26 – 2017-10-29 (×4): 200 mg via ORAL
  Filled 2017-10-26 (×6): qty 1

## 2017-10-26 MED ORDER — PANTOPRAZOLE SODIUM 40 MG PO TBEC
40.0000 mg | DELAYED_RELEASE_TABLET | Freq: Every day | ORAL | Status: DC
  Administered 2017-10-27 – 2017-10-29 (×3): 40 mg via ORAL
  Filled 2017-10-26 (×3): qty 1

## 2017-10-26 MED ORDER — VITAMIN B-12 1000 MCG PO TABS
1000.0000 ug | ORAL_TABLET | Freq: Every day | ORAL | Status: DC
  Administered 2017-10-27 – 2017-10-29 (×3): 1000 ug via ORAL
  Filled 2017-10-26 (×3): qty 1

## 2017-10-26 MED ORDER — MAGNESIUM SULFATE 2 GM/50ML IV SOLN
2.0000 g | Freq: Once | INTRAVENOUS | Status: AC
  Administered 2017-10-26: 2 g via INTRAVENOUS
  Filled 2017-10-26: qty 50

## 2017-10-26 MED ORDER — SODIUM CHLORIDE 0.9% FLUSH: 3.0000 mL | INTRAVENOUS | Status: DC | PRN

## 2017-10-26 MED ORDER — POTASSIUM CHLORIDE CRYS ER 20 MEQ PO TBCR
20.0000 meq | EXTENDED_RELEASE_TABLET | Freq: Once | ORAL | Status: AC
Start: 2017-10-26 — End: 2017-10-26
  Administered 2017-10-26: 20 meq via ORAL
  Filled 2017-10-26: qty 1

## 2017-10-26 MED ORDER — ACETAMINOPHEN 325 MG PO TABS: 650.0000 mg | ORAL_TABLET | ORAL | Status: DC | PRN

## 2017-10-26 MED ORDER — GABAPENTIN 100 MG PO CAPS
200.0000 mg | ORAL_CAPSULE | Freq: Every day | ORAL | Status: DC
  Administered 2017-10-26 – 2017-10-28 (×3): 200 mg via ORAL
  Filled 2017-10-26 (×3): qty 2

## 2017-10-26 MED ORDER — POTASSIUM CHLORIDE CRYS ER 20 MEQ PO TBCR
40.0000 meq | EXTENDED_RELEASE_TABLET | Freq: Once | ORAL | Status: AC
  Administered 2017-10-26: 40 meq via ORAL
  Filled 2017-10-26: qty 2

## 2017-10-26 MED ORDER — ONDANSETRON HCL 4 MG/2ML IJ SOLN
4.0000 mg | Freq: Four times a day (QID) | INTRAMUSCULAR | Status: DC | PRN
  Administered 2017-10-27 – 2017-10-28 (×2): 4 mg via INTRAVENOUS
  Filled 2017-10-26 (×2): qty 2

## 2017-10-26 MED ORDER — SODIUM CHLORIDE 0.9% FLUSH
3.0000 mL | Freq: Two times a day (BID) | INTRAVENOUS | Status: DC
  Administered 2017-10-26 – 2017-10-29 (×6): 3 mL via INTRAVENOUS

## 2017-10-26 MED ORDER — FLUTICASONE PROPIONATE 50 MCG/ACT NA SUSP
2.0000 | Freq: Every day | NASAL | Status: DC | PRN
  Filled 2017-10-26: qty 16

## 2017-10-26 MED ORDER — ADULT MULTIVITAMIN W/MINERALS CH
1.0000 | ORAL_TABLET | Freq: Every day | ORAL | Status: DC
  Administered 2017-10-27 – 2017-10-29 (×3): 1 via ORAL
  Filled 2017-10-26 (×3): qty 1

## 2017-10-26 MED ORDER — ALBUTEROL SULFATE (2.5 MG/3ML) 0.083% IN NEBU: 5.0000 mg | INHALATION_SOLUTION | Freq: Once | RESPIRATORY_TRACT | Status: DC

## 2017-10-26 MED ORDER — ATORVASTATIN CALCIUM 20 MG PO TABS
80.0000 mg | ORAL_TABLET | Freq: Every day | ORAL | Status: DC
  Administered 2017-10-26 – 2017-10-29 (×4): 80 mg via ORAL
  Filled 2017-10-26 (×4): qty 4

## 2017-10-26 MED ORDER — FUROSEMIDE 10 MG/ML IJ SOLN
60.0000 mg | Freq: Once | INTRAMUSCULAR | Status: AC
  Administered 2017-10-26: 60 mg via INTRAVENOUS
  Filled 2017-10-26: qty 8

## 2017-10-26 MED ORDER — BISOPROLOL FUMARATE 5 MG PO TABS
5.0000 mg | ORAL_TABLET | Freq: Every day | ORAL | Status: DC
  Administered 2017-10-26 – 2017-10-27 (×2): 5 mg via ORAL
  Filled 2017-10-26 (×3): qty 1

## 2017-10-26 NOTE — ED Notes (Signed)
Spoke with Lexi, 2A, per Lexie, pt is going to room 236 and they are waiting for room to be cleaned.

## 2017-10-26 NOTE — ED Notes (Signed)
Explained to patient's wife that floor had changed bed assignment to be closer to the nurses station. Pt's wife states "oh come on!" This RN apologized for delay.

## 2017-10-26 NOTE — ED Notes (Signed)
Report off to megan rn. 

## 2017-10-26 NOTE — ED Notes (Signed)
Dr Robinson at bedside 

## 2017-10-26 NOTE — ED Notes (Signed)
Pt sleeping  Family with pt.  Pt waiting on admission bed.  Pt easily aroused.

## 2017-10-26 NOTE — ED Provider Notes (Signed)
[facility] Emergency Department Provider Note    First MD Initiated Contact with Patient 10/26/17 1135     (approximate)  I have reviewed the triage vital signs and the nursing notes.   HISTORY  Chief Complaint Shortness of Breath    HPI Samuel Perry is a 83 y.o. male history of atrial fibrillation as well as congestive heart failure and CAD not on home oxygen recent admission to the hospital for worsening congestive heart failure presents today with 4 pound weight gain over the past week status post discharge and decreased urine output despite increasing his torsemide to 40 mg twice daily.  He does endorse worsening orthopnea and lower extremity edema.  Endorses significant exertional dyspnea and weakness.  No chest pains.  No recent fevers.  No nausea or vomiting.  Past Medical History:  Diagnosis Date  . A-fib ([place])   . CHF (congestive heart failure) ([place])   . Coronary artery disease   . Hypertension    Family History  Problem Relation Age of Onset  . CAD Mother   . CAD Brother    History reviewed. No pertinent surgical history. Patient Active Problem List   Diagnosis Date Noted  . Chronic systolic heart failure ([place]) 10/22/2017  . HTN (hypertension) 10/22/2017  . CKD (chronic kidney disease), stage III ([place]) 10/22/2017  . Lymphedema 10/22/2017  . Atrial fibrillation with RVR ([place]) 10/14/2017  . Acute systolic CHF (congestive heart failure) ([place]) 10/14/2017  . Cardiomyopathy ([place]) 10/14/2017      Prior to Admission medications   Medication Sig Start Date End Date Taking? Authorizing Provider  amiodarone (PACERONE) 100 MG tablet Take 200 mg by mouth 2 (two) times daily.    [provider]  apixaban (ELIQUIS) 2.5 MG TABS tablet Take 1 tablet (2.5 mg total) by mouth 2 (two) times daily. 10/18/17   Loletha Grayer, MD  atorvastatin (LIPITOR) 80 MG tablet Take 80 mg by mouth daily.    [provider]  bisoprolol (ZEBETA) 5  MG tablet Take 1 tablet (5 mg total) by mouth at bedtime. 10/18/17   Loletha Grayer, MD  cyanocobalamin 1000 MCG tablet Take 1,000 mcg by mouth daily.    [provider]  diltiazem (CARDIZEM CD) 180 MG 24 hr capsule Take 1 capsule (180 mg total) by mouth daily. 10/19/17   Loletha Grayer, MD  fluticasone (FLONASE) 50 MCG/ACT nasal spray Place 2 sprays into both nostrils daily.    [provider]  gabapentin (NEURONTIN) 300 MG capsule Take 300 mg by mouth 3 (three) times daily.    [provider]  MAGNESIUM SULFATE PO Take 500 mg by mouth as needed.    [provider]  Multiple Vitamins-Minerals (MULTIVITAMIN WITH MINERALS) tablet Take 1 tablet by mouth daily.    [provider]  omeprazole (PRILOSEC) 40 MG capsule Take 40 mg by mouth daily.    [provider]  potassium chloride SA (K-DUR,KLOR-CON) 20 MEQ tablet Take 20 mEq by mouth 2 (two) times daily.    [provider]  tamsulosin (FLOMAX) 0.4 MG CAPS capsule Take 0.4 mg by mouth 2 (two) times daily. 30 minutes after same daily meal    [provider]  timolol (BETIMOL) 0.5 % ophthalmic solution Place 1 drop into both eyes daily.    [provider]  torsemide (DEMADEX) 20 MG tablet Take 1 tablet (20 mg total) by mouth 2 (two) times daily. 10/18/17   Loletha Grayer, MD  traMADol Veatrice Bourbon) 50  MG tablet Take 50 mg by mouth every 6 (six) hours as needed.    [provider]    Allergies Patient has no known allergies.    Social History Social History   Tobacco Use  . Smoking status: Never Smoker  . Smokeless tobacco: Never Used  Substance Use Topics  . Alcohol use: No  . Drug use: No    Review of Systems Patient denies headaches, rhinorrhea, blurry vision, numbness, shortness of breath, chest pain, edema, cough, abdominal pain, nausea, vomiting, diarrhea, dysuria, fevers, rashes or hallucinations unless otherwise stated above in  HPI. ____________________________________________   PHYSICAL EXAM:  VITAL SIGNS: Vitals:   10/26/17 1042 10/26/17 1200  BP: 114/70 (!) 110/95  Pulse: 60   Resp: 18 18  Temp: (!) 97.4 F (36.3 C)   SpO2: 96%     Constitutional: Alert and oriented. frail appearing but in no acute distress. Eyes: Conjunctivae are normal.  Head: Atraumatic. Nose: No congestion/rhinnorhea. Mouth/Throat: Mucous membranes are moist.   Neck: No stridor. Painless ROM.  Cardiovascular: Normal rate, regular rhythm. Grossly normal heart sounds.  Good peripheral circulation. Respiratory: Mild tachypnea, speaking in short phrases, expiratory crackles to midlung fields with diminished bibasilar breath sounds posteriorly. Gastrointestinal: Soft and nontender. No distention. No abdominal bruits. No CVA tenderness. Musculoskeletal: 2+ pitting edema bilateral lower extremities..  No joint effusions. Neurologic:  Normal speech and language. No gross focal neurologic deficits are appreciated. No facial droop Skin:  Skin is warm, dry and intact. No rash noted. Psychiatric: Mood and affect are normal. Speech and behavior are normal.  ____________________________________________   LABS (all labs ordered are listed, but only abnormal results are displayed)  Results for orders placed or performed during the hospital encounter of 10/26/17 (from the past 24 hour(s))  CBC with Differential     Status: Abnormal   Collection Time: 10/26/17 10:32 AM  Result Value Ref Range   WBC 8.9 3.8 - 10.6 K/uL   RBC 4.39 (L) 4.40 - 5.90 MIL/uL   Hemoglobin 14.4 13.0 - 18.0 g/dL   HCT 43.5 40.0 - 52.0 %   MCV 99.1 80.0 - 100.0 fL   MCH 32.7 26.0 - 34.0 pg   MCHC 33.0 32.0 - 36.0 g/dL   RDW 14.3 11.5 - 14.5 %   Platelets 189 150 - 440 K/uL   Neutrophils Relative % 71 %   Neutro Abs 6.4 1.4 - 6.5 K/uL   Lymphocytes Relative 18 %   Lymphs Abs 1.6 1.0 - 3.6 K/uL   Monocytes Relative 9 %   Monocytes Absolute 0.8 0.2 - 1.0 K/uL    Eosinophils Relative 1 %   Eosinophils Absolute 0.1 0 - 0.7 K/uL   Basophils Relative 1 %   Basophils Absolute 0.1 0 - 0.1 K/uL  Comprehensive metabolic panel     Status: Abnormal   Collection Time: 10/26/17 10:32 AM  Result Value Ref Range   Sodium 137 135 - 145 mmol/L   Potassium 3.2 (L) 3.5 - 5.1 mmol/L   Chloride 98 (L) 101 - 111 mmol/L   CO2 28 22 - 32 mmol/L   Glucose, Bld 151 (H) 65 - 99 mg/dL   BUN 44 (H) 6 - 20 mg/dL   Creatinine, Ser 2.41 (H) 0.61 - 1.24 mg/dL   Calcium 9.2 8.9 - 10.3 mg/dL   Total Protein 6.1 (L) 6.5 - 8.1 g/dL   Albumin 3.6 3.5 - 5.0 g/dL   AST 46 (H) 15 - 41 U/L   ALT  51 17 - 63 U/L   Alkaline Phosphatase 96 38 - 126 U/L   Total Bilirubin 1.2 0.3 - 1.2 mg/dL   GFR calc non Af Amer 22 (L) >60 mL/min   GFR calc Af Amer 26 (L) >60 mL/min   Anion gap 11 5 - 15  Brain natriuretic peptide     Status: Abnormal   Collection Time: 10/26/17 10:32 AM  Result Value Ref Range   B Natriuretic Peptide 2,688.0 (H) 0.0 - 100.0 pg/mL  Troponin I     Status: None   Collection Time: 10/26/17 10:40 AM  Result Value Ref Range   Troponin I <0.03 <0.03 ng/mL  Magnesium     Status: None   Collection Time: 10/26/17 10:40 AM  Result Value Ref Range   Magnesium 2.2 1.7 - 2.4 mg/dL   ____________________________________________  EKG My review and personal interpretation at Time: 10:15   Indication: sob  Rate: 105  Rhythm: sinus Axis: normal Other: normal intervals, no stemi, nonspecific st changes, poor r wave progression ____________________________________________  RADIOLOGY  I personally reviewed all radiographic images ordered to evaluate for the above acute complaints and reviewed radiology reports and findings.  These findings were personally discussed with the patient.  Please see medical record for radiology report.  ____________________________________________   PROCEDURES  Procedure(s) performed:  Procedures    Critical Care performed:  no ____________________________________________   INITIAL IMPRESSION / ASSESSMENT AND PLAN / ED COURSE  Pertinent labs & imaging results that were available during my care of the patient were reviewed by me and considered in my medical decision making (see chart for details).  DDX: chf, edema, anasarca, aki, acs, pna,  KUMAR FALWELL is a 83 y.o. who presents to the ED with worsening exertional dyspnea orthopnea and edema despite increase in his oral diuretics.  Patient is frail appearing and does appear short of breath speaking in short phrases with lung findings as above.  No florid edema on chest x-ray but does have significant lower extremity edema and given his AK I is almost consistent with anasarca.  No evidence of ACS.  Less consistent with infectious process.  Spoke with Dr. Nehemiah Massed of cardiology regarding the patient's presentation and he agrees that due to his failure of outpatient management he would benefit from inpatient admission for further diuresis and consultation with nephrology given his worsening urine output.  Have discussed with the patient and available family all diagnostics and treatments performed thus far and all questions were answered to the best of my ability. The patient demonstrates understanding and agreement with plan.       ____________________________________________   FINAL CLINICAL IMPRESSION(S) / ED DIAGNOSES  Final diagnoses:  Dyspnea on exertion  AKI (acute kidney injury) ([place])  Generalized edema      NEW MEDICATIONS STARTED DURING THIS VISIT:  This SmartLink is deprecated. Use AVSMEDLIST instead to display the medication list for a patient.   Note:  This document was prepared using Dragon voice recognition software and may include unintentional dictation errors.    Merlyn Lot, MD 10/26/17 [phone]

## 2017-10-26 NOTE — H&P (Signed)
[place] [facility] [place] NAME: Samuel Perry    MR#:  [id number]  DATE OF BIRTH:  1927/05/13  DATE OF ADMISSION:  10/26/2017  PRIMARY CARE PHYSICIAN: Idelle Crouch, MD   REQUESTING/REFERRING PHYSICIAN: Amie Critchley MD  CHIEF COMPLAINT:   Chief Complaint  Patient presents with  . Shortness of Breath    HISTORY OF PRESENT ILLNESS: Samuel Perry  is a 83 y.o. male with a known history of  Afib, systolic chf, CAD, hypertension recently hospitalized with acute systolic CHF he was diuresed and discharged home presents back with weight gain and worsening shortness of breath.  According to the patient he is gained 4 pounds in the past 1 week.  Patient also noticed to have systolic function of 21% on his echo last month.  Patient also has chronic kidney disease and noted to have renal function that is worsening. PAST MEDICAL HISTORY:   Past Medical History:  Diagnosis Date  . A-fib ([place])   . CHF (congestive heart failure) ([place])   . Coronary artery disease   . Hypertension     PAST SURGICAL HISTORY: History reviewed. No pertinent surgical history.  SOCIAL HISTORY:  Social History   Tobacco Use  . Smoking status: Never Smoker  . Smokeless tobacco: Never Used  Substance Use Topics  . Alcohol use: No    FAMILY HISTORY:  Family History  Problem Relation Age of Onset  . CAD Mother   . CAD Brother     DRUG ALLERGIES: No Known Allergies  REVIEW OF SYSTEMS:   CONSTITUTIONAL: No fever, fatigue or weakness.  EYES: No blurred or double vision.  EARS, NOSE, AND THROAT: No tinnitus or ear pain.  RESPIRATORY: No cough, positive shortness of breath, wheezing or hemoptysis.  CARDIOVASCULAR: No chest pain, orthopnea, positive edema.  GASTROINTESTINAL: No nausea, vomiting, diarrhea or abdominal pain.  GENITOURINARY: No dysuria, hematuria.  ENDOCRINE: No polyuria, nocturia,  HEMATOLOGY: No anemia, easy bruising or bleeding SKIN: No rash or  lesion. MUSCULOSKELETAL: No joint pain or arthritis.   NEUROLOGIC: No tingling, numbness, weakness.  PSYCHIATRY: No anxiety or depression.   MEDICATIONS AT HOME:  Prior to Admission medications   Medication Sig Start Date End Date Taking? Authorizing Provider  amiodarone (PACERONE) 100 MG tablet Take 200 mg by mouth 2 (two) times daily.   Yes [provider]  apixaban (ELIQUIS) 2.5 MG TABS tablet Take 1 tablet (2.5 mg total) by mouth 2 (two) times daily. 10/18/17  Yes Wieting, Richard, MD  atorvastatin (LIPITOR) 80 MG tablet Take 80 mg by mouth daily.   Yes [provider]  bisoprolol (ZEBETA) 5 MG tablet Take 1 tablet (5 mg total) by mouth at bedtime. 10/18/17  Yes Wieting, Richard, MD  cyanocobalamin 1000 MCG tablet Take 1,000 mcg by mouth daily.   Yes [provider]  diltiazem (CARDIZEM CD) 180 MG 24 hr capsule Take 1 capsule (180 mg total) by mouth daily. 10/19/17  Yes Wieting, Richard, MD  gabapentin (NEURONTIN) 300 MG capsule Take 200 mg at bedtime by mouth.    Yes [provider]  Multiple Vitamins-Minerals (MULTIVITAMIN WITH MINERALS) tablet Take 1 tablet by mouth daily.   Yes [provider]  potassium chloride SA (K-DUR,KLOR-CON) 20 MEQ tablet Take 20 mEq by mouth 2 (two) times daily.   Yes [provider]  tamsulosin (FLOMAX) 0.4 MG CAPS capsule Take 0.4 mg at bedtime by mouth. 30 minutes after same daily meal  Yes [provider]  timolol (BETIMOL) 0.5 % ophthalmic solution Place 1 drop into both eyes daily.   Yes [provider]  torsemide (DEMADEX) 20 MG tablet Take 1 tablet (20 mg total) by mouth 2 (two) times daily. 10/18/17  Yes Wieting, Richard, MD  fluticasone (FLONASE) 50 MCG/ACT nasal spray Place 2 sprays into both nostrils daily.    [provider]  MAGNESIUM SULFATE PO Take 500 mg by mouth as needed.    [provider]  omeprazole (PRILOSEC) 40 MG capsule Take 40 mg by mouth  daily.    [provider]  traMADol (ULTRAM) 50 MG tablet Take 50 mg by mouth every 6 (six) hours as needed.    [provider]      PHYSICAL EXAMINATION:   VITAL SIGNS: Blood pressure 117/80, pulse (!) 112, temperature (!) 97.4 F (36.3 C), temperature source Oral, resp. rate 19, SpO2 97 %.  GENERAL:  83 y.o.-year-old patient lying in the bed with no acute distress.  EYES: Pupils equal, round, reactive to light and accommodation. No scleral icterus. Extraocular muscles intact.  HEENT: Head atraumatic, normocephalic. Oropharynx and nasopharynx clear.  NECK:  Supple, no jugular venous distention. No thyroid enlargement, no tenderness.  LUNGS: Crackles at the bases no accessory muscle usage CARDIOVASCULAR: S1, S2 normal. No murmurs, rubs, or gallops.  ABDOMEN: Soft, nontender, nondistended. Bowel sounds present. No organomegaly or mass.  EXTREMITIES: + pedal edema, cyanosis, or clubbing.  NEUROLOGIC: Cranial nerves II through XII are intact. Muscle strength 5/5 in all extremities. Sensation intact. Gait not checked.  PSYCHIATRIC: The patient is alert and oriented x 3.  SKIN: No obvious rash, lesion, or ulcer.   LABORATORY PANEL:   CBC Recent Labs  Lab 10/26/17 1032  WBC 8.9  HGB 14.4  HCT 43.5  PLT 189  MCV 99.1  MCH 32.7  MCHC 33.0  RDW 14.3  LYMPHSABS 1.6  MONOABS 0.8  EOSABS 0.1  BASOSABS 0.1   ------------------------------------------------------------------------------------------------------------------  Chemistries  Recent Labs  Lab 10/26/17 1032 10/26/17 1040  NA 137  --   K 3.2*  --   CL 98*  --   CO2 28  --   GLUCOSE 151*  --   BUN 44*  --   CREATININE 2.41*  --   CALCIUM 9.2  --   MG  --  2.2  AST 46*  --   ALT 51  --   ALKPHOS 96  --   BILITOT 1.2  --    ------------------------------------------------------------------------------------------------------------------ estimated creatinine clearance is 22.6 mL/min (A) (by C-G  formula based on SCr of 2.41 mg/dL (H)). ------------------------------------------------------------------------------------------------------------------ No results for input(s): TSH, T4TOTAL, T3FREE, THYROIDAB in the last 72 hours.  Invalid input(s): FREET3   Coagulation profile No results for input(s): INR, PROTIME in the last 168 hours. ------------------------------------------------------------------------------------------------------------------- No results for input(s): DDIMER in the last 72 hours. -------------------------------------------------------------------------------------------------------------------  Cardiac Enzymes Recent Labs  Lab 10/26/17 1040  TROPONINI <0.03   ------------------------------------------------------------------------------------------------------------------ Invalid input(s): POCBNP  ---------------------------------------------------------------------------------------------------------------  Urinalysis    Component Value Date/Time   COLORURINE YELLOW 10/26/2017 1216   APPEARANCEUR HAZY (A) 10/26/2017 1216   LABSPEC 1.010 10/26/2017 1216   PHURINE 5.5 10/26/2017 1216   GLUCOSEU NEGATIVE 10/26/2017 1216   HGBUR NEGATIVE 10/26/2017 1216   BILIRUBINUR NEGATIVE 10/26/2017 1216   KETONESUR NEGATIVE 10/26/2017 1216   PROTEINUR NEGATIVE 10/26/2017 1216   NITRITE NEGATIVE 10/26/2017 1216   LEUKOCYTESUR NEGATIVE 10/26/2017 1216     RADIOLOGY: Dg Chest 2 View  Result Date: 10/26/2017 CLINICAL DATA:  Shortness of breath and fluid retention for 1 week. EXAM: CHEST  2 VIEW COMPARISON:  10/16/2017 FINDINGS: Normal heart size. Stable mediastinal contours. Small pleural effusions with atelectatic type opacity greater at the left base. No Kerley lines, air bronchogram, or pneumothorax. IMPRESSION: Trace effusions and mild atelectasis.  No pulmonary edema. Electronically Signed   By: Monte Fantasia M.D.   On: 10/26/2017 10:51    EKG: Orders  placed or performed during the hospital encounter of 10/26/17  . ED EKG  . ED EKG  . EKG 12-Lead  . EKG 12-Lead    IMPRESSION AND PLAN: Pt is 83 y.o with sever systolic chf presents with worsening shortness of breath  1.  Acute on chronic systolic CHF I would treat patient with IV Lasix 40 mg Monitor renal function closely Continue [facility] consult Nephrology consult due to worsening renal function I will start patient on digoxin  2.  Atrial fibrillation continue amiodarone Continue Eliquis Continue Cardizem  3.  Acute renal failure on chronic kidney disease stage II monitor renal function Nephrology consult  4.  Essential hypertension  5.  CODE STATUS DNR confirmed with the patient All the records are reviewed and case discussed with ED provider. Management plans discussed with the patient, family and they are in agreement.  CODE STATUS:    Code Status Orders  (From admission, onward)        Start     Ordered   10/26/17 1349  Do not attempt resuscitation (DNR)  Continuous    Question Answer Comment  In the event of cardiac or respiratory ARREST Do not call a "code blue"   In the event of cardiac or respiratory ARREST Do not perform Intubation, CPR, defibrillation or ACLS   In the event of cardiac or respiratory ARREST Use medication by any route, position, wound care, and other measures to relive pain and suffering. May use oxygen, suction and manual treatment of airway obstruction as needed for comfort.      10/26/17 1348    Code Status History    Date Active Date Inactive Code Status Order ID Comments User Context   10/14/2017 15:50 10/18/2017 17:15 DNR [id number]  Vaughan Basta, MD ED       TOTAL TIME TAKING CARE OF THIS PATIENT:55 minutes.    Dustin Flock M.D on 10/26/2017 at 2:00 PM  Between 7am to 6pm - Pager - [phone]  After 6pm go to [email or web address] - password [email or web address] [facility]  Office   [phone]  CC: Primary care physician; Idelle Crouch, MD

## 2017-10-26 NOTE — ED Notes (Signed)
Primary RN notified that pt in room and is attached to cardiac monitor.

## 2017-10-26 NOTE — ED Notes (Signed)
First Nurse: pt was sent to ER by Dr Nehemiah Massed for fluid retention and increased shortness of breath, going on for one week.

## 2017-10-27 LAB — BASIC METABOLIC PANEL
Anion gap: 9 (ref 5–15)
BUN: 40 mg/dL — AB (ref 6–20)
CO2: 30 mmol/L (ref 22–32)
CREATININE: 2.08 mg/dL — AB (ref 0.61–1.24)
Calcium: 8.7 mg/dL — ABNORMAL LOW (ref 8.9–10.3)
Chloride: 103 mmol/L (ref 101–111)
GFR calc Af Amer: 31 mL/min — ABNORMAL LOW (ref >60)
GFR calc non Af Amer: 27 mL/min — ABNORMAL LOW (ref >60)
Glucose, Bld: 104 mg/dL — ABNORMAL HIGH (ref 65–99)
Potassium: 3 mmol/L — ABNORMAL LOW (ref 3.5–5.1)
Sodium: 142 mmol/L (ref 135–145)

## 2017-10-27 LAB — POTASSIUM: Potassium: 4.3 mmol/L (ref 3.5–5.1)

## 2017-10-27 LAB — PHOSPHORUS: Phosphorus: 4 mg/dL (ref 2.5–4.6)

## 2017-10-27 MED ORDER — POLYETHYLENE GLYCOL 3350 17 G PO PACK
17.0000 g | PACK | Freq: Every day | ORAL | Status: DC
  Administered 2017-10-27 – 2017-10-29 (×3): 17 g via ORAL
  Filled 2017-10-27 (×3): qty 1

## 2017-10-27 MED ORDER — POTASSIUM CHLORIDE CRYS ER 20 MEQ PO TBCR
40.0000 meq | EXTENDED_RELEASE_TABLET | ORAL | Status: AC
  Administered 2017-10-27 (×2): 40 meq via ORAL
  Filled 2017-10-27 (×2): qty 2

## 2017-10-27 NOTE — Progress Notes (Signed)
[place] at [place] NAME: Samuel Perry    MR#:  [id number]  DATE OF BIRTH:  Feb 14, 1927  SUBJECTIVE:  CHIEF COMPLAINT: Resting comfortably.  Shortness of breath improved but still has lower extremity edema.  No chest pain.  Wife at bedside.  REVIEW OF SYSTEMS:  CONSTITUTIONAL: No fever, fatigue or weakness.  EYES: No blurred or double vision.  EARS, NOSE, AND THROAT: No tinnitus or ear pain.  RESPIRATORY: No cough, shortness of breath, wheezing or hemoptysis.  CARDIOVASCULAR: No chest pain, orthopnea, reports lower extremity edema.  GASTROINTESTINAL: No nausea, vomiting, diarrhea or abdominal pain.  GENITOURINARY: No dysuria, hematuria.  ENDOCRINE: No polyuria, nocturia,  HEMATOLOGY: No anemia, easy bruising or bleeding SKIN: No rash or lesion. MUSCULOSKELETAL: No joint pain or arthritis.   NEUROLOGIC: No tingling, numbness, weakness.  PSYCHIATRY: No anxiety or depression.   DRUG ALLERGIES:  No Known Allergies  VITALS:  Blood pressure 93/69, pulse (!) 41, temperature (!) 97.5 F (36.4 C), temperature source Oral, resp. rate 18, height 5\' 11"  (1.803 m), weight 84.9 kg (187 lb 3.2 oz), SpO2 93 %.  PHYSICAL EXAMINATION:  GENERAL:  83 y.o.-year-old patient lying in the bed with no acute distress.  EYES: Pupils equal, round, reactive to light and accommodation. No scleral icterus. Extraocular muscles intact.  HEENT: Head atraumatic, normocephalic. Oropharynx and nasopharynx clear.  NECK:  Supple, no jugular venous distention. No thyroid enlargement, no tenderness.  LUNGS: Mod breath sounds bilaterally, no wheezing, rales,rhonchi or crepitation. No use of accessory muscles of respiration.  CARDIOVASCULAR: S1, S2 normal. No murmurs, rubs, or gallops.  ABDOMEN: Soft, nontender, nondistended. Bowel sounds present. No organomegaly or mass.  EXTREMITIES: 2+ pedal edema, no cyanosis, or clubbing.  NEUROLOGIC: Cranial nerves II through XII  are intact. Muscle strength 5/5 in all extremities. Sensation intact. Gait not checked.  PSYCHIATRIC: The patient is alert and oriented x 3.  SKIN: No obvious rash, lesion, or ulcer.    LABORATORY PANEL:   CBC Recent Labs  Lab 10/26/17 1032  WBC 8.9  HGB 14.4  HCT 43.5  PLT 189   ------------------------------------------------------------------------------------------------------------------  Chemistries  Recent Labs  Lab 10/26/17 1032 10/26/17 1040 10/27/17 0348  NA 137  --  142  K 3.2*  --  3.0*  CL 98*  --  103  CO2 28  --  30  GLUCOSE 151*  --  104*  BUN 44*  --  40*  CREATININE 2.41*  --  2.08*  CALCIUM 9.2  --  8.7*  MG  --  2.2  --   AST 46*  --   --   ALT 51  --   --   ALKPHOS 96  --   --   BILITOT 1.2  --   --    ------------------------------------------------------------------------------------------------------------------  Cardiac Enzymes Recent Labs  Lab 10/26/17 1040  TROPONINI <0.03   ------------------------------------------------------------------------------------------------------------------  RADIOLOGY:  Dg Chest 2 View  Result Date: 10/26/2017 CLINICAL DATA:  Shortness of breath and fluid retention for 1 week. EXAM: CHEST  2 VIEW COMPARISON:  10/16/2017 FINDINGS: Normal heart size. Stable mediastinal contours. Small pleural effusions with atelectatic type opacity greater at the left base. No Kerley lines, air bronchogram, or pneumothorax. IMPRESSION: Trace effusions and mild atelectasis.  No pulmonary edema. Electronically Signed   By: Monte Fantasia M.D.   On: 10/26/2017 10:51    EKG:   Orders placed or performed during the hospital encounter of 10/26/17  . ED  EKG  . ED EKG  . EKG 12-Lead  . EKG 12-Lead    ASSESSMENT AND PLAN:   Pt is 83 y.o with sever systolic chf presents with worsening shortness of breath  1.  Acute on chronic systolic CHF IV Lasix 40 mg Monitor renal function closely Continue [facility]  consulted, f/u with [facility] cardio Nephrology consult due to worsening renal function, 2.4-2.08 started patient on digoxin  2.  Atrial fibrillation continue amiodarone Continue Eliquis Continue Cardizem  3.  Acute renal failure on chronic kidney disease stage II monitor renal function Nephrology consult  4.  Essential hypertension  5.  CODE STATUS DNR confirmed with the patient       All the records are reviewed and case discussed with Care Management/Social Workerr. Management plans discussed with the patient, wife and they are in agreement.  CODE STATUS:   TOTAL TIME TAKING CARE OF THIS PATIENT: 36  minutes.   POSSIBLE D/C IN 1-2 DAYS, DEPENDING ON CLINICAL CONDITION.  Note: This dictation was prepared with Dragon dictation along with smaller phrase technology. Any transcriptional errors that result from this process are unintentional.   [name] M.D on 10/27/2017 at 4:10 PM  Between 7am to 6pm - Pager - [phone] After 6pm go to [email or web address] - password [email or web address] [place] [facility]  Office  [phone]  CC: Primary care physician; Idelle Crouch, MD

## 2017-10-27 NOTE — Care Management Note (Signed)
Case Management Note  Patient Details  Name: DEAIRE MCWHIRTER MRN: [id number] Date of Birth: 1927/10/02  Subjective/Objective:                 Readmission for heart failure.  This CM had discussed home health follow up  during last admission in October and patient was to have discussed with his wife.  He discharged without any services.   Chronic Eliquis.  Patient may benefit from a home lasix protocol.   Action/Plan: Will discuss home health services again with wife and patient together  Expected Discharge Date:                  Expected Discharge Plan:     In-House Referral:     Discharge planning Services     Post Acute Care Choice:    Choice offered to:     DME Arranged:    DME Agency:     HH Arranged:    [place] Agency:     Status of Service:     If discussed at [employer] [facility] [employer], dates discussed:    Additional Comments:  Katrina Stack, RN 10/27/2017, 8:59 AM

## 2017-10-27 NOTE — Progress Notes (Signed)
MEDICATION RELATED CONSULT NOTE - INITIAL   Pharmacy Consult for electrolyte consult Indication: hypokalemia  No Known Allergies  Patient Measurements: Height: 5\' 11"  (180.3 cm) Weight: 187 lb 3.2 oz (84.9 kg) IBW/kg (Calculated) : 75.3  Assessment: Pharmacy consulted to monitor and replace electrolytes if needed in this 83 year old male with hypokalemia.  Goal of Therapy: Electrolytes WNL  Plan:  Mg = 2.2 was WNL on admission yesterday and pt received 2 g IV magnesium. Phos = 4 is WNL K=4.3  No supplementation needed  Will recheck electrolytes with AM labs tomorrow.  Ramond Dial, PharmD, BCPS Clinical Pharmacist 10/27/2017,7:16 PM

## 2017-10-27 NOTE — Progress Notes (Signed)
MEDICATION RELATED CONSULT NOTE - INITIAL   Pharmacy Consult for electrolyte consult Indication: hypokalemia  No Known Allergies  Patient Measurements: Height: 5\' 11"  (180.3 cm) Weight: 187 lb 3.2 oz (84.9 kg) IBW/kg (Calculated) : 75.3  Assessment: Pharmacy consulted to monitor and replace electrolytes if needed in this 83 year old male with hypokalemia.  Goal of Therapy: Electrolytes WNL  Plan:  Mg = 2.2 was WNL on admission yesterday and pt received 2 g IV magnesium. Phos = 4 is WNL K = 3 is low  Patient already has orders for KCl 40 mEq PO q4h x 2 doses  Will recheck electrolytes with AM labs tomorrow.  Lenis Noon, PharmD, BCPS Clinical Pharmacist 10/27/2017,1:06 PM

## 2017-10-27 NOTE — Consult Note (Signed)
[facility]  CONSULT NOTE    Date: 10/27/2017                  Patient Name:  Samuel Perry  MRN: [id number]  DOB: 12/17/1927  Age / Sex: 83 y.o., male         PCP: Idelle Crouch, MD                 Service Requesting Consult: Dr. Margaretmary Eddy                 Reason for Consult: Acute renal failure            History of Present Illness: Samuel Perry is a 83 y.o. white male with hypertension, coronary artery disease, atrial fibrillation, congestive heart failure, BPH, who was admitted to [facility] on 10/26/2017 for Generalized edema [R60.1] Dyspnea on exertion [R06.09] AKI (acute kidney injury) ([place]) [N17.9]   Wife and daughter at bedside. Patient was admitted to [facility] from 10/24 - 10/28 for acute exacerbation of congestive heart failure. Discharged with creatinine of 1.69. He was admitted with creatinine of 2.41. IV furosemide given.   Recently had his outpatient torsemide increased with no improvement.    Medications: Outpatient medications: Medications Prior to Admission  Medication Sig Dispense Refill Last Dose  . amiodarone (PACERONE) 100 MG tablet Take 200 mg by mouth 2 (two) times daily.   10/26/2017 at 0800  . apixaban (ELIQUIS) 2.5 MG TABS tablet Take 1 tablet (2.5 mg total) by mouth 2 (two) times daily. 60 tablet 0 10/26/2017 at 0800  . atorvastatin (LIPITOR) 80 MG tablet Take 80 mg by mouth daily.   10/25/2017 at 2200  . bisoprolol (ZEBETA) 5 MG tablet Take 1 tablet (5 mg total) by mouth at bedtime. 30 tablet 0 10/25/2017 at 2200  . cyanocobalamin 1000 MCG tablet Take 1,000 mcg by mouth daily.   10/26/2017 at 0800  . diltiazem (CARDIZEM CD) 180 MG 24 hr capsule Take 1 capsule (180 mg total) by mouth daily. 30 capsule 0 10/26/2017 at 0800  . gabapentin (NEURONTIN) 300 MG capsule Take 200 mg at bedtime by mouth.    10/25/2017 at 2200  . Multiple Vitamins-Minerals (MULTIVITAMIN WITH MINERALS) tablet Take 1 tablet by mouth daily.   10/26/2017 at 0800  . potassium  chloride SA (K-DUR,KLOR-CON) 20 MEQ tablet Take 20 mEq by mouth 2 (two) times daily.   10/26/2017 at 0800  . tamsulosin (FLOMAX) 0.4 MG CAPS capsule Take 0.4 mg at bedtime by mouth. 30 minutes after same daily meal    10/25/2017 at 2200  . timolol (BETIMOL) 0.5 % ophthalmic solution Place 1 drop into both eyes daily.   10/26/2017 at 0800  . torsemide (DEMADEX) 20 MG tablet Take 1 tablet (20 mg total) by mouth 2 (two) times daily. 60 tablet 0 10/26/2017 at 0800  . fluticasone (FLONASE) 50 MCG/ACT nasal spray Place 2 sprays into both nostrils daily.   prn at prn  . MAGNESIUM SULFATE PO Take 500 mg by mouth as needed.   prn at prn  . omeprazole (PRILOSEC) 40 MG capsule Take 40 mg by mouth daily.   prn at prn  . traMADol (ULTRAM) 50 MG tablet Take 50 mg by mouth every 6 (six) hours as needed.   prn at prn    Current medications: Current Facility-Administered Medications  Medication Dose Route Frequency Provider Last Rate Last Dose  . 0.9 %  sodium chloride infusion  250 mL Intravenous  PRN Dustin Flock, MD      . acetaminophen (TYLENOL) tablet 650 mg  650 mg Oral Q4H PRN Dustin Flock, MD      . amiodarone (PACERONE) tablet 200 mg  200 mg Oral BID Dustin Flock, MD   200 mg at 10/26/17 2228  . apixaban (ELIQUIS) tablet 2.5 mg  2.5 mg Oral BID Dustin Flock, MD   2.5 mg at 10/27/17 1116  . atorvastatin (LIPITOR) tablet 80 mg  80 mg Oral Daily Dustin Flock, MD   80 mg at 10/27/17 1116  . bisoprolol (ZEBETA) tablet 5 mg  5 mg Oral QHS Dustin Flock, MD   5 mg at 10/26/17 2231  . digoxin (LANOXIN) tablet 0.125 mg  0.125 mg Oral Daily Dustin Flock, MD   0.125 mg at 10/26/17 2228  . diltiazem (CARDIZEM CD) 24 hr capsule 180 mg  180 mg Oral Daily Dustin Flock, MD      . fluticasone (FLONASE) 50 MCG/ACT nasal spray 2 spray  2 spray Each Nare Daily PRN Dustin Flock, MD      . furosemide (LASIX) injection 40 mg  40 mg Intravenous BID Dustin Flock, MD   40 mg at 10/27/17 0820  .  gabapentin (NEURONTIN) capsule 200 mg  200 mg Oral QHS Dustin Flock, MD   200 mg at 10/26/17 2228  . multivitamin with minerals tablet 1 tablet  1 tablet Oral Daily Dustin Flock, MD   1 tablet at 10/27/17 1115  . ondansetron (ZOFRAN) injection 4 mg  4 mg Intravenous Q6H PRN Dustin Flock, MD      . pantoprazole (PROTONIX) EC tablet 40 mg  40 mg Oral Daily Dustin Flock, MD   40 mg at 10/27/17 1116  . potassium chloride SA (K-DUR,KLOR-CON) CR tablet 40 mEq  40 mEq Oral Q4H Gouru, Aruna, MD   40 mEq at 10/27/17 0827  . sodium chloride flush (NS) 0.9 % injection 3 mL  3 mL Intravenous Q12H Dustin Flock, MD   3 mL at 10/26/17 2231  . sodium chloride flush (NS) 0.9 % injection 3 mL  3 mL Intravenous PRN Dustin Flock, MD      . tamsulosin (FLOMAX) capsule 0.4 mg  0.4 mg Oral QHS Dustin Flock, MD   0.4 mg at 10/26/17 2228  . timolol (BETIMOL) 0.5 % ophthalmic solution 1 drop  1 drop Both Eyes Daily Dustin Flock, MD   1 drop at 10/27/17 1123  . traMADol (ULTRAM) tablet 50 mg  50 mg Oral Q6H PRN Dustin Flock, MD      . vitamin B-12 (CYANOCOBALAMIN) tablet 1,000 mcg  1,000 mcg Oral Daily Dustin Flock, MD   1,000 mcg at 10/27/17 1117      Allergies: No Known Allergies    Past Medical History: Past Medical History:  Diagnosis Date  . A-fib ([place])   . CHF (congestive heart failure) ([place])   . CKD (chronic kidney disease), stage II   . Coronary artery disease   . Hypertension      Past Surgical History: History reviewed. No pertinent surgical history.   Family History: Family History  Problem Relation Age of Onset  . CAD Mother   . CAD Brother      Social History: Social History   Socioeconomic History  . Marital status: Unknown    Spouse name: Not on file  . Number of children: Not on file  . Years of education: Not on file  . Highest education level: Not on file  Social Needs  .  Financial resource strain: Not on file  . Food insecurity - worry: Not on  file  . Food insecurity - inability: Not on file  . Transportation needs - medical: Not on file  . Transportation needs - non-medical: Not on file  Occupational History  . Not on file  Tobacco Use  . Smoking status: Never Smoker  . Smokeless tobacco: Never Used  Substance and Sexual Activity  . Alcohol use: No  . Drug use: No  . Sexual activity: Not on file  Other Topics Concern  . Not on file  Social History Narrative  . Not on file     Review of Systems: Review of Systems  Constitutional: Negative.  Negative for chills, diaphoresis, fever, malaise/fatigue and weight loss.  HENT: Negative.  Negative for congestion, ear discharge, ear pain, hearing loss, nosebleeds, sinus pain, sore throat and tinnitus.   Eyes: Negative.  Negative for blurred vision, double vision, photophobia, pain, discharge and redness.  Respiratory: Positive for shortness of breath. Negative for cough, hemoptysis, sputum production, wheezing and stridor.   Cardiovascular: Positive for leg swelling. Negative for chest pain, palpitations, orthopnea, claudication and PND.  Gastrointestinal: Negative.  Negative for abdominal pain, blood in stool, constipation, diarrhea, heartburn, melena, nausea and vomiting.  Genitourinary: Negative.  Negative for dysuria, flank pain, frequency, hematuria and urgency.  Musculoskeletal: Negative.  Negative for back pain, falls, joint pain, myalgias and neck pain.  Skin: Negative.  Negative for itching and rash.  Neurological: Negative.  Negative for dizziness, tingling, tremors, sensory change, speech change, focal weakness, seizures, loss of consciousness, weakness and headaches.  Endo/Heme/Allergies: Negative.  Negative for environmental allergies and polydipsia. Does not bruise/bleed easily.  Psychiatric/Behavioral: Negative.  Negative for depression, hallucinations, memory loss, substance abuse and suicidal ideas. The patient is not nervous/anxious and does not have insomnia.      Vital Signs: Blood pressure 93/69, pulse (!) 41, temperature (!) 97.5 F (36.4 C), temperature source Oral, resp. rate 18, height 5\' 11"  (1.803 m), weight 84.9 kg (187 lb 3.2 oz), SpO2 93 %.  Weight trends: Filed Weights   10/26/17 2141 10/27/17 0351  Weight: 85.2 kg (187 lb 12.8 oz) 84.9 kg (187 lb 3.2 oz)    Physical Exam: General: NAD, laying in bed  Head: Normocephalic, atraumatic. Moist oral mucosal membranes  Eyes: Anicteric, PERRL  Neck: Supple, trachea midline  Lungs:  Clear to auscultation  Heart: bradycardia  Abdomen:  Soft, nontender,   Extremities:  trace peripheral edema.  Neurologic: Nonfocal, moving all four extremities  Skin: No lesions        Lab results: Basic Metabolic Panel: Recent Labs  Lab 10/26/17 1032 10/26/17 1040 10/27/17 0348  NA 137  --  142  K 3.2*  --  3.0*  CL 98*  --  103  CO2 28  --  30  GLUCOSE 151*  --  104*  BUN 44*  --  40*  CREATININE 2.41*  --  2.08*  CALCIUM 9.2  --  8.7*  MG  --  2.2  --   PHOS  --   --  4.0    Liver Function Tests: Recent Labs  Lab 10/26/17 1032  AST 46*  ALT 51  ALKPHOS 96  BILITOT 1.2  PROT 6.1*  ALBUMIN 3.6   No results for input(s): LIPASE, AMYLASE in the last 168 hours. No results for input(s): AMMONIA in the last 168 hours.  CBC: Recent Labs  Lab 10/26/17 1032  WBC 8.9  NEUTROABS 6.4  HGB 14.4  HCT 43.5  MCV 99.1  PLT 189    Cardiac Enzymes: Recent Labs  Lab 10/26/17 1040  TROPONINI <0.03    BNP: Invalid input(s): POCBNP  CBG: No results for input(s): GLUCAP in the last 168 hours.  Microbiology: No results found for this or any previous visit.  Coagulation Studies: No results for input(s): LABPROT, INR in the last 72 hours.  Urinalysis: Recent Labs    10/26/17 1216  COLORURINE YELLOW  LABSPEC 1.010  PHURINE 5.5  GLUCOSEU NEGATIVE  HGBUR NEGATIVE  BILIRUBINUR NEGATIVE  KETONESUR NEGATIVE  PROTEINUR NEGATIVE  NITRITE NEGATIVE  LEUKOCYTESUR NEGATIVE       Imaging: Dg Chest 2 View  Result Date: 10/26/2017 CLINICAL DATA:  Shortness of breath and fluid retention for 1 week. EXAM: CHEST  2 VIEW COMPARISON:  10/16/2017 FINDINGS: Normal heart size. Stable mediastinal contours. Small pleural effusions with atelectatic type opacity greater at the left base. No Kerley lines, air bronchogram, or pneumothorax. IMPRESSION: Trace effusions and mild atelectasis.  No pulmonary edema. Electronically Signed   By: Monte Fantasia M.D.   On: 10/26/2017 10:51      Assessment & Plan: Samuel Perry is a 83 y.o. white male with hypertension, coronary artery disease, atrial fibrillation, congestive heart failure, BPH, who was admitted to [facility] on 10/26/2017 for Generalized edema [R60.1] Dyspnea on exertion [R06.09] AKI (acute kidney injury) ([place]) [N17.9]   1. Acute Renal Failure on chronic kidney disease stage III: baseline creatinine 1.2 GFR of 57 08/28/17. Bland urine.  Chronic kidney disease secondary to hypertension Acute renal failure secondary to acute cardiorenal syndrome.  - Continue furosemide IV  2. Hypertension: with bradycardia and atrial fibrillation - bisoprolol, amiodarone and digoxin for rate control       LOS: 1 Samuel Perry 11/6/201812:59 PM

## 2017-10-28 LAB — BASIC METABOLIC PANEL
Anion gap: 7 (ref 5–15)
BUN: 33 mg/dL — AB (ref 6–20)
CHLORIDE: 105 mmol/L (ref 101–111)
CO2: 29 mmol/L (ref 22–32)
CREATININE: 1.99 mg/dL — AB (ref 0.61–1.24)
Calcium: 8.5 mg/dL — ABNORMAL LOW (ref 8.9–10.3)
GFR calc Af Amer: 33 mL/min — ABNORMAL LOW (ref >60)
GFR, EST NON AFRICAN AMERICAN: 28 mL/min — AB (ref >60)
GLUCOSE: 108 mg/dL — AB (ref 65–99)
POTASSIUM: 3.8 mmol/L (ref 3.5–5.1)
Sodium: 141 mmol/L (ref 135–145)

## 2017-10-28 LAB — MAGNESIUM: Magnesium: 2.3 mg/dL (ref 1.7–2.4)

## 2017-10-28 MED ORDER — FUROSEMIDE 40 MG PO TABS
40.0000 mg | ORAL_TABLET | Freq: Two times a day (BID) | ORAL | Status: DC
  Administered 2017-10-28 – 2017-10-29 (×2): 40 mg via ORAL
  Filled 2017-10-28 (×2): qty 1

## 2017-10-28 MED ORDER — POTASSIUM CHLORIDE CRYS ER 20 MEQ PO TBCR
20.0000 meq | EXTENDED_RELEASE_TABLET | Freq: Two times a day (BID) | ORAL | Status: DC
  Administered 2017-10-28 – 2017-10-29 (×3): 20 meq via ORAL
  Filled 2017-10-28 (×3): qty 1

## 2017-10-28 NOTE — Progress Notes (Signed)
Per MD Kolluru, transition patient from IV furosemide to PO furosemide. Verbal orders received. Will continue to monitor pt.

## 2017-10-28 NOTE — Evaluation (Signed)
Physical Therapy Evaluation Patient Details Name: Samuel Perry MRN: [id number] DOB: [birth year] Today's Date: 10/28/2017   History of Present Illness  Samuel Perry is a 83 y.o. male with a known history of Afib, systolic CHF, CAD, hypertension recently hospitalized with acute systolic CHF he was diuresed and discharged home presents back with weight gain and worsening shortness of breath.  According to the patient, he gained 4 pounds in the past 1 week.  Patient also noticed to have systolic function of 27% on his echo last month.  Patient also has chronic kidney disease and noted to have renal function that is worsening. He is currently admitted for acute on chronic CHF, a-fib, and acute on chronic renal failure.   Clinical Impression  Pt admitted with above diagnosis. Pt currently with functional limitations due to the deficits listed below (see PT Problem List).  Pt is modified independent for bed mobility and transfers. He is able to ambulate 2 laps around RN station with [place] from therapist. Initially pt utilizes a rolling walker but progresses to using chair rail on wall. Pt is mildly unsteady without UE support during ambulation. HR remains between 100-110 bpm and SaO2 remians between 91-94% on room air. Pt denies DOE. Fatigue monitored throughout ambulation. He demonstrates some balance deficits with feet together and eyes closed as well as in single leg stance. Pt encouraged to utilize spc vs rolling walker initially at discharge. Recommend continuation of [place] PT following discharge. He will benefit from PT services to address deficits in strength, balance, and mobility in order to return to full function at home.     Follow Up Recommendations Home health PT    Equipment Recommendations  None recommended by PT    Recommendations for Other Services       Precautions / Restrictions Precautions Precautions: Fall Restrictions Weight Bearing Restrictions: No      Mobility  Bed  Mobility Overal bed mobility: Modified Independent             General bed mobility comments: Fair speed and suquencing. HOB elevated and use of bed rails  Transfers Overall transfer level: Modified independent Equipment used: Rolling walker (2 wheeled)             General transfer comment: Pt demonstrates safe hand placement and good stability with transfer. He does require slight increase in time to perform transfer but overall functional and safe  Ambulation/Gait Ambulation/Gait assistance: Min guard Ambulation Distance (Feet): 350 Feet Assistive device: Rolling walker (2 wheeled) Gait Pattern/deviations: Step-through pattern   Gait velocity interpretation: <1.8 ft/sec, indicative of risk for recurrent falls General Gait Details: Pt able to ambulate 2 laps around RN station with therapist. Initially pt utilizes a rolling walker but progresses to using chair rail on wall. Pt is mildly unsteady without UE support during ambulation. HR remains between 100-110 bpm and SaO2 remians between 91-94% on room air. Pt denies DOE. Fatigue monitored throughout ambulation  Stairs            Wheelchair Mobility    Modified Rankin (Stroke Patients Only)       Balance Overall balance assessment: Needs assistance Sitting-balance support: Bilateral upper extremity supported;Feet supported Sitting balance-Leahy Scale: Normal     Standing balance support: No upper extremity supported Standing balance-Leahy Scale: Fair Standing balance comment: Able to maintain feet apart with eyes open/closed. Able to place feet together but requires UE support. Positive [place] with standing balance and eyes closed. Single leg balance is 3-4 seconds bilaterally  Pertinent Vitals/Pain Pain Assessment: No/denies pain    Home Living Family/patient expects to be discharged to:: Private residence Living Arrangements: Spouse/significant other Available  Help at Discharge: Family Type of Home: House Home Access: Level entry;Stairs to enter Entrance Stairs-Rails: None Entrance Stairs-Number of Steps: 3 Home Layout: Two level;Able to live on main level with bedroom/bathroom Home Equipment: Grab bars - tub/shower;Cane - single point;Walker - 2 wheels      Prior Function Level of Independence: Independent         Comments: Plays golf 3x/week, very active     Hand Dominance   Dominant Hand: Right    Extremity/Trunk Assessment   Upper Extremity Assessment Upper Extremity Assessment: Overall WFL for tasks assessed    Lower Extremity Assessment Lower Extremity Assessment: Overall WFL for tasks assessed       Communication   Communication: No difficulties  Cognition Arousal/Alertness: Awake/alert Behavior During Therapy: WFL for tasks assessed/performed Overall Cognitive Status: Within Functional Limits for tasks assessed                                        General Comments      Exercises     Assessment/Plan    PT Assessment Patient needs continued PT services  PT Problem List Decreased balance;Cardiopulmonary status limiting activity;Decreased mobility       PT Treatment Interventions Gait training;DME instruction;Functional mobility training;Therapeutic activities;Therapeutic exercise;Balance training;Patient/family education;Neuromuscular re-education    PT Goals (Current goals can be found in the Care Plan section)  Acute Rehab PT Goals Patient Stated Goal: To go home. PT Goal Formulation: With patient/family Time For Goal Achievement: 11/11/17 Potential to Achieve Goals: Good    Frequency Min 2X/week   Barriers to discharge        Co-evaluation               AM-PAC PT "6 Clicks" Daily Activity  Outcome Measure Difficulty turning over in bed (including adjusting bedclothes, sheets and blankets)?: None Difficulty moving from lying on back to sitting on the side of the bed? :  None Difficulty sitting down on and standing up from a chair with arms (e.g., wheelchair, bedside commode, etc,.)?: None Help needed moving to and from a bed to chair (including a wheelchair)?: A Little Help needed walking in hospital room?: A Little Help needed climbing 3-5 steps with a railing? : A Little 6 Click Score: 21    End of Session Equipment Utilized During Treatment: Gait belt Activity Tolerance: Patient tolerated treatment well Patient left: in bed;with family/visitor present;with bed alarm set;with call bell/phone within reach Nurse Communication: Mobility status PT Visit Diagnosis: Difficulty in walking, not elsewhere classified (R26.2);Unsteadiness on feet (R26.81)    Time: 5732-2025 PT Time Calculation (min) (ACUTE ONLY): 27 min   Charges:   PT Evaluation $PT Eval Low Complexity: 1 Low PT Treatments $Gait Training: 8-22 mins   PT G Codes:   PT G-Codes **NOT FOR INPATIENT CLASS** Functional Assessment Tool Used: AM-PAC 6 Clicks Basic Mobility Functional Limitation: Mobility: Walking and moving around Mobility: Walking and Moving Around Current Status (K2706): At least 20 percent but less than 40 percent impaired, limited or restricted Mobility: Walking and Moving Around Goal Status [phone]): At least 1 percent but less than 20 percent impaired, limited or restricted    Phillips Grout PT, DPT    Eliannah Hinde 10/28/2017, 2:31 PM

## 2017-10-28 NOTE — Progress Notes (Signed)
MEDICATION RELATED CONSULT NOTE - INITIAL   Pharmacy Consult for electrolyte consult Indication: hypokalemia  No Known Allergies  Patient Measurements: Height: 5\' 11"  (180.3 cm) Weight: 186 lb 14.4 oz (84.8 kg) IBW/kg (Calculated) : 75.3  Assessment: Pharmacy consulted to monitor and replace electrolytes if needed in this 83 year old male with hypokalemia.  Goal of Therapy: Electrolytes WNL  Plan:  K = 3.8, Mg = 2.3 are WNL.  Will resume patient's home dose of KCl 20 mEq PO BID. No additional supplementation needed at this time.   Will recheck electrolytes with AM labs tomorrow since patient is on IV furosemide.  Lenis Noon, PharmD, BCPS Clinical Pharmacist 10/28/2017,2:02 PM

## 2017-10-28 NOTE — Progress Notes (Signed)
[facility] [place] NAME: Samuel Perry    MR#:  [id number]  DATE OF BIRTH:  [birth year]  SUBJECTIVE:  CHIEF COMPLAINT: Resting comfortably.  Shortness of breath improved but still has lower extremity edema. Wife at bedside.  REVIEW OF SYSTEMS:  CONSTITUTIONAL: No fever, fatigue or weakness.  EYES: No blurred or double vision.  EARS, NOSE, AND THROAT: No tinnitus or ear pain.  RESPIRATORY: No cough, shortness of breath, wheezing or hemoptysis.  CARDIOVASCULAR: No chest pain, orthopnea, reports lower extremity edema.  GASTROINTESTINAL: No nausea, vomiting, diarrhea or abdominal pain.  GENITOURINARY: No dysuria, hematuria.  ENDOCRINE: No polyuria, nocturia,  HEMATOLOGY: No anemia, easy bruising or bleeding SKIN: No rash or lesion. MUSCULOSKELETAL: No joint pain or arthritis.   NEUROLOGIC: No tingling, numbness, weakness.  PSYCHIATRY: No anxiety or depression.   DRUG ALLERGIES:  No Known Allergies  VITALS:  Blood pressure 105/71, pulse (!) 57, temperature 98.2 F (36.8 C), temperature source Oral, resp. rate 17, height 5\' 11"  (1.803 m), weight 84.8 kg (186 lb 14.4 oz), SpO2 97 %.  PHYSICAL EXAMINATION:  GENERAL:  83 y.o.-year-old patient lying in the bed with no acute distress.  EYES: Pupils equal, round, reactive to light and accommodation. No scleral icterus. Extraocular muscles intact.  HEENT: Head atraumatic, normocephalic. Oropharynx and nasopharynx clear.  NECK:  Supple, no jugular venous distention. No thyroid enlargement, no tenderness.  LUNGS: Mod breath sounds bilaterally, no wheezing, rales,rhonchi or crepitation. No use of accessory muscles of respiration.  CARDIOVASCULAR: Irregularly regular no murmurs, rubs, or gallops.  ABDOMEN: Soft, nontender, nondistended. Bowel sounds present. No organomegaly or mass.  EXTREMITIES: 1+ pedal edema, no cyanosis, or clubbing.  NEUROLOGIC: Cranial nerves II through XII are intact.  Muscle strength 5/5 in all extremities. Sensation intact. Gait not checked.  PSYCHIATRIC: The patient is alert and oriented x 3.  SKIN: No obvious rash, lesion, or ulcer.    LABORATORY PANEL:   CBC Recent Labs  Lab 10/26/17 1032  WBC 8.9  HGB 14.4  HCT 43.5  PLT 189   ------------------------------------------------------------------------------------------------------------------  Chemistries  Recent Labs  Lab 10/26/17 1032  10/28/17 0357  NA 137   < > 141  K 3.2*   < > 3.8  CL 98*   < > 105  CO2 28   < > 29  GLUCOSE 151*   < > 108*  BUN 44*   < > 33*  CREATININE 2.41*   < > 1.99*  CALCIUM 9.2   < > 8.5*  MG  --    < > 2.3  AST 46*  --   --   ALT 51  --   --   ALKPHOS 96  --   --   BILITOT 1.2  --   --    < > = values in this interval not displayed.   ------------------------------------------------------------------------------------------------------------------  Cardiac Enzymes Recent Labs  Lab 10/26/17 1040  TROPONINI <0.03   ------------------------------------------------------------------------------------------------------------------  RADIOLOGY:  No results found.  EKG:   Orders placed or performed during the hospital encounter of 10/26/17  . ED EKG  . ED EKG  . EKG 12-Lead  . EKG 12-Lead    ASSESSMENT AND PLAN:   Pt is 83 y.o with sever systolic chf presents with worsening shortness of breath  1.  Acute on chronic systolic CHF IV Lasix 40 mg will be changed to p.o. Lasix as patient is clinically better Monitor renal function closely, improving with diuresis  [facility] consulted, f/[facility] with [facility] cardio Nephrology consult due to worsening renal function, 2.4-2.08-1.9 started patient on digoxin  2.  Atrial fibrillation with RVR continue amiodarone, digoxin, check digoxin levels in the a.m. Continue bisoprolol Continue Eliquis Continue Cardizem  3.  Acute renal failure on chronic kidney disease stage II monitor renal  function Nephrology following  4.  Essential hypertension  5.  CODE STATUS DNR confirmed with the patient  PT consulted for deconditioning     All the records are reviewed and case discussed with Care Management/Social Workerr. Management plans discussed with the patient, wife and they are in agreement.  CODE STATUS:   TOTAL TIME TAKING CARE OF THIS PATIENT: 36  minutes.   POSSIBLE D/C IN 1-2 DAYS, DEPENDING ON CLINICAL CONDITION.  Note: This dictation was prepared with Dragon dictation along with smaller phrase technology. Any transcriptional errors that result from this process are unintentional.   [name] M.D on 10/28/2017 at 10:03 PM  Between 7am to 6pm - Pager - [phone] After 6pm go to [email or web address] - password [email or web address] [place] [facility]  Office  [phone]  CC: Primary care physician; Idelle Crouch, MD

## 2017-10-28 NOTE — Progress Notes (Addendum)
HF educational session with patient and wife.  Patient with known hx of Afib, Systolic CHF, CAD, HTN.  Patient was recently here for acute systolic CHF - was diuresed and sent home.  Patient returned to hospital with 4 pound weight gain in one week and worsening SOB.  Patient with 10% EF on his most recent echo, which was last month.  Admitted with dx of acute on chronic CHF, a-fib, and acute on chronic renal failure.    Provided patient with "Living Better with Heart Failure" packet. Briefly reviewed definition of heart failure and signs and symptoms of an exacerbation.  *Reviewed importance of and reason behind checking weight daily in the AM, after using the bathroom, but before getting dressed. Patient has been weighing himself daily.  Patient stated his weight has been pretty stable and therefore, he has not been recording it, as he is able to remember the value.   ? Reviewed the following information with patient and wife.   *Discussed when to call the Dr= weight gain of >2lb overnight of 5lb in a week,  *Discussed yellow zone= call MD: weight gain of >2lb overnight of 5lb in a week, increased swelling, increased SOB when lying down, chest discomfort, dizziness, increased fatigue *Red Zone= call 911: struggle to breath, fainting or near fainting, significant chest pain  ? *Reviewed low sodium diet-provided handout of recommended and not recommended foods. ? *Instructed patient to take medications as prescribed for heart failure. Explained briefly why pt is on the medications (either make you feel better, live longer or keep you out of the hospital) and discussed monitoring and side effects.  ? *Discussed exercise. Patient is normally very active, as he and his wife report patient plays golf three times a week and he does all the yard work.  PT evaluated patient and is recommending [facility].    Role of [facility] discussed. Explained to patient our protocol for HF patients is for patients to be  seen within 7 days of discharge.  Patient stated he has been seen in the HF Clinic once before and really liked Darylene Price, NP.  Heart Failure Clinic Appointment scheduled for November 05, 2017 at 12:20 a.m.  [place] with appointment time given to patient and wife.    Roanna Epley, RN, BSN, [facility]  Cardiovascular & Pulmonary Nurse Navigator  Direct Line: [phone]  Department Phone #: [phone] Fax: [phone]  Email Address: [email or web address]

## 2017-10-28 NOTE — Progress Notes (Signed)
[facility]  ROUNDING NOTE   Subjective:   Wife at bedside.   UOP 1150 - IV furosemide 40mg    Creatinine 1.99 (2.08)   Objective:  Vital signs in last 24 hours:  Temp:  [97.4 F (36.3 C)-97.5 F (36.4 C)] 97.4 F (36.3 C) (11/07 0325) Pulse Rate:  [75-114] 102 (11/07 1048) Resp:  [17-18] 18 (11/07 0325) BP: (98-122)/(66-80) 122/69 (11/07 1048) SpO2:  [90 %-98 %] 97 % (11/07 1048) Weight:  [84.8 kg (186 lb 14.4 oz)] 84.8 kg (186 lb 14.4 oz) (11/07 0325)  Weight change: -0.408 kg (-14.4 oz) Filed Weights   10/26/17 2141 10/27/17 0351 10/28/17 0325  Weight: 85.2 kg (187 lb 12.8 oz) 84.9 kg (187 lb 3.2 oz) 84.8 kg (186 lb 14.4 oz)    Intake/Output: I/O last 3 completed shifts: In: 9 [P.O.:480; I.V.:3] Out: 2400 [Urine:2400]   Intake/Output this shift:  Total I/O In: 243 [P.O.:240; I.V.:3] Out: 200 [Urine:200]  Physical Exam: General: NAD,   Head: Normocephalic, atraumatic. Moist oral mucosal membranes  Eyes: Anicteric, PERRL  Neck: Supple, trachea midline  Lungs:  Clear to auscultation  Heart: irregular  Abdomen:  Soft, nontender,   Extremities: no peripheral edema.  Neurologic: Nonfocal, moving all four extremities  Skin: No lesions       Basic Metabolic Panel: Recent Labs  Lab 10/26/17 1032 10/26/17 1040 10/27/17 0348 10/27/17 1811 10/28/17 0357  NA 137  --  142  --  141  K 3.2*  --  3.0* 4.3 3.8  CL 98*  --  103  --  105  CO2 28  --  30  --  29  GLUCOSE 151*  --  104*  --  108*  BUN 44*  --  40*  --  33*  CREATININE 2.41*  --  2.08*  --  1.99*  CALCIUM 9.2  --  8.7*  --  8.5*  MG  --  2.2  --   --  2.3  PHOS  --   --  4.0  --   --     Liver Function Tests: Recent Labs  Lab 10/26/17 1032  AST 46*  ALT 51  ALKPHOS 96  BILITOT 1.2  PROT 6.1*  ALBUMIN 3.6   No results for input(s): LIPASE, AMYLASE in the last 168 hours. No results for input(s): AMMONIA in the last 168 hours.  CBC: Recent Labs  Lab 10/26/17 1032   WBC 8.9  NEUTROABS 6.4  HGB 14.4  HCT 43.5  MCV 99.1  PLT 189    Cardiac Enzymes: Recent Labs  Lab 10/26/17 1040  TROPONINI <0.03    BNP: Invalid input(s): POCBNP  CBG: No results for input(s): GLUCAP in the last 168 hours.  Microbiology: No results found for this or any previous visit.  Coagulation Studies: No results for input(s): LABPROT, INR in the last 72 hours.  Urinalysis: Recent Labs    10/26/17 1216  COLORURINE YELLOW  LABSPEC 1.010  PHURINE 5.5  GLUCOSEU NEGATIVE  HGBUR NEGATIVE  BILIRUBINUR NEGATIVE  KETONESUR NEGATIVE  PROTEINUR NEGATIVE  NITRITE NEGATIVE  LEUKOCYTESUR NEGATIVE      Imaging: No results found.   Medications:   . sodium chloride     . amiodarone  200 mg Oral BID  . apixaban  2.5 mg Oral BID  . atorvastatin  80 mg Oral Daily  . bisoprolol  5 mg Oral QHS  . digoxin  0.125 mg Oral Daily  . diltiazem  180 mg Oral Daily  .  furosemide  40 mg Intravenous BID  . gabapentin  200 mg Oral QHS  . multivitamin with minerals  1 tablet Oral Daily  . pantoprazole  40 mg Oral Daily  . polyethylene glycol  17 g Oral Daily  . potassium chloride  20 mEq Oral BID  . sodium chloride flush  3 mL Intravenous Q12H  . tamsulosin  0.4 mg Oral QHS  . timolol  1 drop Both Eyes Daily  . cyanocobalamin  1,000 mcg Oral Daily   sodium chloride, acetaminophen, fluticasone, ondansetron (ZOFRAN) IV, sodium chloride flush, traMADol  Assessment/ Plan:  [name] is a 83 y.o. white male with hypertension, coronary artery disease, atrial fibrillation, congestive heart failure, BPH, who was admitted to [facility] on 10/26/2017  1. Acute Renal Failure on chronic kidney disease stage III: baseline creatinine 1.2 GFR of 57 08/28/17. Bland urine.  Chronic kidney disease secondary to hypertension Acute renal failure secondary to acute cardiorenal syndrome.  - Transition furosemide to PO  2. Hypertension: with bradycardia and atrial fibrillation. Blood  pressure at goal. Irregular rhythm on examination.  - bisoprolol, amiodarone and digoxin for rate control   LOS: Samuel Perry, [place] 11/7/20183:53 PM

## 2017-10-28 NOTE — Plan of Care (Signed)
No complaints of pain this shift, family at bedside helping pt up to use the bathroom, tolerating well. Potassium back WDL.BP stable.

## 2017-10-28 NOTE — Consult Note (Signed)
   [facility] Inpatient Consult   [id number]  Gregor Dershem [facility] [birth year] [phone]   Patient is currently active with [facility] for chronic disease management services.  Patient has been engaged by a [employer].  Our community based plan of care has focused on disease management and community resource support.  Patient will receive a post discharge transition of care call and will be evaluated for monthly home visits for assessments and disease process education.  Inpatient Case Manager aware that [facility] following. Of note, [facility] Care Management services does not replace or interfere with any services that are needed or arranged by inpatient case management or social work.  For additional questions or referrals please contact:  Nazli Penn RN, [facility] Liaison  [phone]) [place] [phone]) Toll free office

## 2017-10-28 NOTE — Care Management (Signed)
Spoke with patient and his wife regarding home health nurse and lasix protocol.  Mrs Modi does not feel this service is needed because- " I give him the medicines as the doctor said.  I did not do anything wrong."  CM Concurred with her that she has done everything correctly .  Recommendation of home health nurse does not imply that anyone thinks she has done anything wrong with her husband's care. Patient relays that he is seen by a home health therapist but does not know the name.  CM found agency is [facility] and confirmed. Patient and wife will think about adding a nurse

## 2017-10-29 ENCOUNTER — Other Ambulatory Visit: Payer: Self-pay | Admitting: *Deleted

## 2017-10-29 ENCOUNTER — Ambulatory Visit: Payer: Self-pay | Admitting: *Deleted

## 2017-10-29 LAB — BASIC METABOLIC PANEL
ANION GAP: 9 (ref 5–15)
BUN: 32 mg/dL — AB (ref 6–20)
CHLORIDE: 102 mmol/L (ref 101–111)
CO2: 29 mmol/L (ref 22–32)
Calcium: 8.6 mg/dL — ABNORMAL LOW (ref 8.9–10.3)
Creatinine, Ser: 2.05 mg/dL — ABNORMAL HIGH (ref 0.61–1.24)
GFR calc Af Amer: 32 mL/min — ABNORMAL LOW (ref >60)
GFR calc non Af Amer: 27 mL/min — ABNORMAL LOW (ref >60)
Glucose, Bld: 108 mg/dL — ABNORMAL HIGH (ref 65–99)
POTASSIUM: 3.6 mmol/L (ref 3.5–5.1)
SODIUM: 140 mmol/L (ref 135–145)

## 2017-10-29 LAB — CBC
HEMATOCRIT: 38.4 % — AB (ref 40.0–52.0)
HEMOGLOBIN: 12.8 g/dL — AB (ref 13.0–18.0)
MCH: 32.8 pg (ref 26.0–34.0)
MCHC: 33.4 g/dL (ref 32.0–36.0)
MCV: 98.2 fL (ref 80.0–100.0)
Platelets: 138 10*3/uL — ABNORMAL LOW (ref 150–440)
RBC: 3.91 MIL/uL — ABNORMAL LOW (ref 4.40–5.90)
RDW: 14.2 % (ref 11.5–14.5)
WBC: 5.5 10*3/uL (ref 3.8–10.6)

## 2017-10-29 LAB — MAGNESIUM: Magnesium: 2.2 mg/dL (ref 1.7–2.4)

## 2017-10-29 MED ORDER — DIGOXIN 125 MCG PO TABS: 0.1250 mg | ORAL_TABLET | Freq: Every day | ORAL | 0 refills | Status: AC

## 2017-10-29 MED ORDER — ONDANSETRON HCL 4 MG PO TABS: 4.0000 mg | ORAL_TABLET | Freq: Three times a day (TID) | ORAL | 0 refills | Status: AC | PRN

## 2017-10-29 MED ORDER — FUROSEMIDE 40 MG PO TABS: 40.0000 mg | ORAL_TABLET | Freq: Two times a day (BID) | ORAL | 0 refills | Status: AC

## 2017-10-29 NOTE — Plan of Care (Signed)
Pt remains free from infection, lungs without crackles, small amount of lower extremety edema.  PT working with pt.  Fall precautions in place, non skid socks in place when oob.

## 2017-10-29 NOTE — Progress Notes (Signed)
Pt discharged to home via wc.  Instructions  given to pt.  Questions answered.  No distress.  

## 2017-10-29 NOTE — Patient Outreach (Signed)
This RN CM followed pt with [facility]/ transition of care following October 24-28,2018 hospitalization for CHF, Atrial fib. Referral received on pt 10/29 from [facility] liaison.  View in Epic pt readmitted 10/26/17 for sob,acute CHF, 2 admits within 30 days.    Plan:  RN CM to follow up with pt again post discharge.    Samuel Perry.   [facility]  [phone]

## 2017-10-29 NOTE — Progress Notes (Signed)
Physical Therapy Treatment Patient Details Name: Samuel Perry MRN: [id number] DOB: 1927/09/17 Today's Date: 10/29/2017    History of Present Illness Samuel Perry is a 83 y.o. male with a known history of Afib, systolic CHF, CAD, hypertension recently hospitalized with acute systolic CHF he was diuresed and discharged home presents back with weight gain and worsening shortness of breath.  According to the patient, he gained 4 pounds in the past 1 week.  Patient also noticed to have systolic function of 26% on his echo last month.  Patient also has chronic kidney disease and noted to have renal function that is worsening. He is currently admitted for acute on chronic CHF, a-fib, and acute on chronic renal failure.     PT Comments    Pt able to increase his ambulation distance today with therapy. He is able to complete 3 laps around RN station with therapist. He utilizes a rolling walker throughout ambulation distance. Vitals monitored during ambulation and HR remains below 100 bpm. SaO2 runs between 93-96% on room air. Faituge monitored and pt denies DOE. Pt is safe to return home when medically appropriate. Pt will benefit from PT services to address deficits in strength, balance, and mobility in order to return to full function at home.   Follow Up Recommendations  Home health PT     Equipment Recommendations  None recommended by PT    Recommendations for Other Services       Precautions / Restrictions Precautions Precautions: Fall Restrictions Weight Bearing Restrictions: No    Mobility  Bed Mobility Overal bed mobility: Modified Independent             General bed mobility comments: Fair speed and sequencing. HOB elevated but no bed rails needed today.  Transfers Overall transfer level: Modified independent Equipment used: Rolling walker (2 wheeled)             General transfer comment: Pt demonstrates improved speed/sequencing with transfers  today  Ambulation/Gait Ambulation/Gait assistance: Min guard Ambulation Distance (Feet): 500 Feet Assistive device: Rolling walker (2 wheeled) Gait Pattern/deviations: Step-through pattern   Gait velocity interpretation: <1.8 ft/sec, indicative of risk for recurrent falls General Gait Details: Pt is able to complete 3 laps around RN station with therapist. He utilizes a rolling walker throughout ambulation distance. Vitals monitored during ambulation and HR remains below 100 bpm. SaO2 runs between 93-96% on room air. Faituge monitored and pt denies DOE   Financial trader Rankin (Stroke Patients Only)       Balance Overall balance assessment: Needs assistance Sitting-balance support: Bilateral upper extremity supported;Feet supported Sitting balance-Leahy Scale: Normal Sitting balance - Comments: Maintains midline without deviation.   Standing balance support: No upper extremity supported Standing balance-Leahy Scale: Fair Standing balance comment: Able to maintain feet apart with eyes open/closed. Able to place feet together but requires UE support. Positive Rhomberg with standing balance and eyes closed. Single leg balance is 3-4 seconds bilaterally                            Cognition Arousal/Alertness: Awake/alert Behavior During Therapy: WFL for tasks assessed/performed Overall Cognitive Status: Within Functional Limits for tasks assessed                                 General Comments:  Pt is pleasant and conversant      Exercises      General Comments        Pertinent Vitals/Pain Pain Assessment: No/denies pain    Home Living                      Prior Function            PT Goals (current goals can now be found in the care plan section) Acute Rehab PT Goals Patient Stated Goal: To go home. PT Goal Formulation: With patient/family Time For Goal Achievement: 11/11/17 Potential to  Achieve Goals: Good Progress towards PT goals: Progressing toward goals    Frequency    Min 2X/week      PT Plan Discharge plan needs to be updated    Co-evaluation              AM-PAC PT "6 Clicks" Daily Activity  Outcome Measure  Difficulty turning over in bed (including adjusting bedclothes, sheets and blankets)?: None Difficulty moving from lying on back to sitting on the side of the bed? : None Difficulty sitting down on and standing up from a chair with arms (e.g., wheelchair, bedside commode, etc,.)?: None Help needed moving to and from a bed to chair (including a wheelchair)?: None Help needed walking in hospital room?: A Little Help needed climbing 3-5 steps with a railing? : A Little 6 Click Score: 22    End of Session Equipment Utilized During Treatment: Gait belt Activity Tolerance: Patient tolerated treatment well Patient left: in bed;with family/visitor present;with bed alarm set;with call bell/phone within reach   PT Visit Diagnosis: Difficulty in walking, not elsewhere classified (R26.2);Unsteadiness on feet (R26.81)     Time: 3212-2482 PT Time Calculation (min) (ACUTE ONLY): 11 min  Charges:  $Gait Training: 8-22 mins                    G Codes:       Lyndel Safe Aviela Blundell PT, DPT     Mikya Don 10/29/2017, 10:13 AM

## 2017-10-29 NOTE — Discharge Summary (Signed)
[facility], 83 y.o., DOB [birth year], MRN [id number]. Admission date: 10/26/2017 Discharge Date 10/29/2017 Primary MD Idelle Crouch, MD Admitting Physician Dustin Flock, MD  Admission Diagnosis  Generalized edema [R60.1] Dyspnea on exertion [R06.09] AKI (acute kidney injury) [facility]) [N17.9]  Discharge Diagnosis   Active Problems: Acute on chronic systolic CHF Acute renal failure on chronic kidney disease stage III Essential hypertension Bradycardia Chronic atrial fibrillation   Hospital Course : Samuel Perry  is a 83 y.o. male with a known history of  Afib, systolic chf, CAD, hypertension recently hospitalized with acute systolic CHF he was diuresed and discharged home presents back with weight gain and worsening shortness of breath.  According to the patient he is gained 4 pounds in the past 1 week.  Patient also noticed to have systolic function of 76% on his echo last month.  Patient was admitted for acute on chronic systolic CHF as well as acute on chronic renal failure.  He was treated with Lasix with improvement of his symptoms.  His renal function improved as well.  He was seen by cardiology and nephrology.  He will need close outpatient follow-up.              Consults  Nephrology, cardiology  Significant Tests:  See full reports for all details    Dg Chest 1 View  Result Date: 10/16/2017 CLINICAL DATA:  Increased wheezing and shortness of breath EXAM: CHEST 1 VIEW COMPARISON:  10/15/2017, 10/14/2017 FINDINGS: Mild cardiomegaly. Similar appearance of vascular congestion and hazy bibasilar opacity. Small pleural effusions. Aortic atherosclerosis. No pneumothorax. IMPRESSION: Continued cardiomegaly, small pleural effusions, vascular congestion and pulmonary edema without much interval change. Mild bibasilar atelectasis or infiltrates. Electronically Signed   By: Donavan Foil M.D.   On: 10/16/2017 19:31   Dg Chest 1  View  Result Date: 10/14/2017 CLINICAL DATA:  Shortness of Breath EXAM: CHEST 1 VIEW COMPARISON:  None. FINDINGS: Bilateral perihilar and lower lobe airspace opacities likely reflects edema. Small effusions suspected. Mild cardiomegaly. Atherosclerotic change in the aortic arch. No acute bony abnormality. IMPRESSION: Cardiomegaly with bilateral perihilar and lower lobe opacities as above with small effusions, likely CHF. Electronically Signed   By: Rolm Baptise M.D.   On: 10/14/2017 13:23   Dg Chest 2 View  Result Date: 10/26/2017 CLINICAL DATA:  Shortness of breath and fluid retention for 1 week. EXAM: CHEST  2 VIEW COMPARISON:  10/16/2017 FINDINGS: Normal heart size. Stable mediastinal contours. Small pleural effusions with atelectatic type opacity greater at the left base. No Kerley lines, air bronchogram, or pneumothorax. IMPRESSION: Trace effusions and mild atelectasis.  No pulmonary edema. Electronically Signed   By: Monte Fantasia M.D.   On: 10/26/2017 10:51   Dg Chest 2 View  Result Date: 10/15/2017 CLINICAL DATA:  CHF. EXAM: CHEST  2 VIEW COMPARISON:  10/14/2017 . FINDINGS: Cardiomegaly with pulmonary vascular prominence and bilateral interstitial prominence. Bilateral pleural effusions. Findings consistent with CHF. No interim change. IMPRESSION: Congestive heart failure with pulmonary interstitial edema bilateral pleural effusions again noted. No interim change . Electronically Signed   By: Marcello Moores  Register   On: 10/15/2017 08:05       Today   Subjective:   Samuel Perry patient's breathing is improved wants to go home Objective:   Blood pressure 100/76, pulse 72, temperature 97.6 F (36.4 C), temperature source Oral, resp. rate 15, height 5\' 11"  (1.803 m), weight 187 lb 11.2 oz (85.1 kg),  SpO2 93 %.  .  Intake/Output Summary (Last 24 hours) at 10/29/2017 1524 Last data filed at 10/29/2017 0944 Gross per 24 hour  Intake 483 ml  Output 1400 ml  Net -917 ml    Exam VITAL  SIGNS: Blood pressure 100/76, pulse 72, temperature 97.6 F (36.4 C), temperature source Oral, resp. rate 15, height 5\' 11"  (1.803 m), weight 187 lb 11.2 oz (85.1 kg), SpO2 93 %.  GENERAL:  83 y.o.-year-old patient lying in the bed with no acute distress.  EYES: Pupils equal, round, reactive to light and accommodation. No scleral icterus. Extraocular muscles intact.  HEENT: Head atraumatic, normocephalic. Oropharynx and nasopharynx clear.  NECK:  Supple, no jugular venous distention. No thyroid enlargement, no tenderness.  LUNGS: Normal breath sounds bilaterally, no wheezing, rales,rhonchi or crepitation. No use of accessory muscles of respiration.  CARDIOVASCULAR: S1, S2 normal. No murmurs, rubs, or gallops.  ABDOMEN: Soft, nontender, nondistended. Bowel sounds present. No organomegaly or mass.  EXTREMITIES: No pedal edema, cyanosis, or clubbing.  NEUROLOGIC: Cranial nerves II through XII are intact. Muscle strength 5/5 in all extremities. Sensation intact. Gait not checked.  PSYCHIATRIC: The patient is alert and oriented x 3.  SKIN: No obvious rash, lesion, or ulcer.   Data Review     CBC w Diff:  Lab Results  Component Value Date   WBC 5.5 10/29/2017   HGB 12.8 (L) 10/29/2017   HCT 38.4 (L) 10/29/2017   PLT 138 (L) 10/29/2017   LYMPHOPCT 18 10/26/2017   MONOPCT 9 10/26/2017   EOSPCT 1 10/26/2017   BASOPCT 1 10/26/2017   CMP:  Lab Results  Component Value Date   NA 140 10/29/2017   K 3.6 10/29/2017   CL 102 10/29/2017   CO2 29 10/29/2017   BUN 32 (H) 10/29/2017   CREATININE 2.05 (H) 10/29/2017   PROT 6.1 (L) 10/26/2017   ALBUMIN 3.6 10/26/2017   BILITOT 1.2 10/26/2017   ALKPHOS 96 10/26/2017   AST 46 (H) 10/26/2017   ALT 51 10/26/2017  .  Micro Results No results found for this or any previous visit (from the past 240 hour(s)).   Code Status History    Date Active Date Inactive Code Status Order ID Comments User Context   10/26/2017 21:37 10/29/2017 14:15 Full  Code [id number]  Dustin Flock, MD Inpatient   10/26/2017 13:48 10/26/2017 21:37 DNR [id number]  Dustin Flock, MD ED   10/14/2017 15:50 10/18/2017 17:15 DNR [id number]  Vaughan Basta, MD ED    Advance Directive Documentation     Most Recent Value  Type of Advance Directive  Living will, Healthcare Power of Attorney  Pre-existing out of facility DNR order (yellow form or pink MOST form)  No data  "MOST" Form in Place?  No data          Follow-up Information    [facility] Follow up on 11/05/2017.   Specialty:  Cardiology Why:  at 12:20pm Contact information: [place] [address] [place] [phone]       Corey Skains, MD Follow up on 11/06/2017.   Specialty:  Cardiology Why:  chf f/u, Appointment Time: 12:30pm Contact information: [facility] [place] [address] [phone]        Lavonia Dana, MD Follow up on 11/25/2017.   Specialty:  Internal Medicine Why:  arf, Appointment Time: 9:30am Contact information: [address] [place] [address] [phone]  Discharge Medications   Allergies as of 10/29/2017   No Known Allergies     Medication List    STOP taking these medications   torsemide 20 MG tablet Commonly known as:  DEMADEX     TAKE these medications   amiodarone 100 MG tablet Commonly known as:  PACERONE Take 200 mg by mouth 2 (two) times daily.   apixaban 2.5 MG Tabs tablet Commonly known as:  ELIQUIS Take 1 tablet (2.5 mg total) by mouth 2 (two) times daily.   atorvastatin 80 MG tablet Commonly known as:  LIPITOR Take 80 mg by mouth daily.   bisoprolol 5 MG tablet Commonly known as:  ZEBETA Take 1 tablet (5 mg total) by mouth at bedtime.   cyanocobalamin 1000 MCG tablet Take 1,000 mcg by mouth daily.   digoxin 0.125 MG tablet Commonly known as:  LANOXIN Take  1 tablet (0.125 mg total) daily by mouth.   diltiazem 180 MG 24 hr capsule Commonly known as:  CARDIZEM CD Take 1 capsule (180 mg total) by mouth daily.   fluticasone 50 MCG/ACT nasal spray Commonly known as:  FLONASE Place 2 sprays into both nostrils daily.   furosemide 40 MG tablet Commonly known as:  LASIX Take 1 tablet (40 mg total) 2 (two) times daily by mouth.   gabapentin 300 MG capsule Commonly known as:  NEURONTIN Take 200 mg at bedtime by mouth.   MAGNESIUM SULFATE PO Take 500 mg by mouth as needed.   multivitamin with minerals tablet Take 1 tablet by mouth daily.   omeprazole 40 MG capsule Commonly known as:  PRILOSEC Take 40 mg by mouth daily.   ondansetron 4 MG tablet Commonly known as:  ZOFRAN Take 1 tablet (4 mg total) every 8 (eight) hours as needed by mouth for nausea or vomiting.   potassium chloride SA 20 MEQ tablet Commonly known as:  K-DUR,KLOR-CON Take 20 mEq by mouth 2 (two) times daily.   tamsulosin 0.4 MG Caps capsule Commonly known as:  FLOMAX Take 0.4 mg at bedtime by mouth. 30 minutes after same daily meal   timolol 0.5 % ophthalmic solution Commonly known as:  BETIMOL Place 1 drop into both eyes daily.   traMADol 50 MG tablet Commonly known as:  ULTRAM Take 50 mg by mouth every 6 (six) hours as needed.          Total Time in preparing paper work, data evaluation and todays exam - 35 minutes  Dustin Flock M.D on 10/29/2017 at [facility] PM  [facility]   Office  [phone]

## 2017-10-29 NOTE — Discharge Instructions (Addendum)
Heart Failure Clinic appointment on November 05 2017 at 12:20pm with Darylene Price, [place]. Please call [phone] to reschedule.   [place] [facility] [place]:  Cardiac diet  DISCHARGE CONDITION:  Stable  ACTIVITY:  Activity as tolerated  OXYGEN:  Home Oxygen: No.   Oxygen Delivery: room air  DISCHARGE LOCATION:  home    ADDITIONAL DISCHARGE INSTRUCTION: please make sure watch how much salt u eat   If you experience worsening of your admission symptoms, develop shortness of breath, life threatening emergency, suicidal or homicidal thoughts you must seek medical attention immediately by calling 911 or calling your MD immediately  if symptoms less severe.  You Must read complete instructions/literature along with all the possible adverse reactions/side effects for all the Medicines you take and that have been prescribed to you. Take any new Medicines after you have completely understood and accpet all the possible adverse reactions/side effects.   Please note  You were cared for by a hospitalist during your hospital stay. If you have any questions about your discharge medications or the care you received while you were in the hospital after you are discharged, you can call the unit and asked to speak with the hospitalist on call if the hospitalist that took care of you is not available. Once you are discharged, your primary care physician will handle any further medical issues. Please note that NO REFILLS for any discharge medications will be authorized once you are discharged, as it is imperative that you return to your primary care physician (or establish a relationship with a primary care physician if you do not have one) for your aftercare needs so that they can reassess your need for medications and monitor your lab values.

## 2017-10-29 NOTE — Progress Notes (Signed)
[facility]  ROUNDING NOTE   Subjective:   Wife at bedside.   Walked around nurses station this morning.   UOP 1400   Objective:  Vital signs in last 24 hours:  Temp:  [97.6 F (36.4 C)-98.2 F (36.8 C)] 97.6 F (36.4 C) (11/08 0734) Pulse Rate:  [57-111] 72 (11/08 0734) Resp:  [15-18] 15 (11/08 0734) BP: (88-122)/(59-89) 100/76 (11/08 0734) SpO2:  [84 %-99 %] 93 % (11/08 0734) Weight:  [85.1 kg (187 lb 11.2 oz)] 85.1 kg (187 lb 11.2 oz) (11/08 0339)  Weight change: 0.363 kg (12.8 oz) Filed Weights   10/27/17 0351 10/28/17 0325 10/29/17 0339  Weight: 84.9 kg (187 lb 3.2 oz) 84.8 kg (186 lb 14.4 oz) 85.1 kg (187 lb 11.2 oz)    Intake/Output: I/O last 3 completed shifts: In: 723 [P.O.:720; I.V.:3] Out: 2000 [Urine:2000]   Intake/Output this shift:  Total I/O In: 243 [P.O.:240; I.V.:3] Out: 200 [Urine:200]  Physical Exam: General: NAD,   Head: Normocephalic, atraumatic. Moist oral mucosal membranes  Eyes: Anicteric, PERRL  Neck: Supple, trachea midline  Lungs:  Clear to auscultation  Heart: irregular  Abdomen:  Soft, nontender,   Extremities: no peripheral edema.  Neurologic: Nonfocal, moving all four extremities  Skin: No lesions       Basic Metabolic Panel: Recent Labs  Lab 10/26/17 1032 10/26/17 1040 10/27/17 0348 10/27/17 1811 10/28/17 0357 10/29/17 0556  NA 137  --  142  --  141 140  K 3.2*  --  3.0* 4.3 3.8 3.6  CL 98*  --  103  --  105 102  CO2 28  --  30  --  29 29  GLUCOSE 151*  --  104*  --  108* 108*  BUN 44*  --  40*  --  33* 32*  CREATININE 2.41*  --  2.08*  --  1.99* 2.05*  CALCIUM 9.2  --  8.7*  --  8.5* 8.6*  MG  --  2.2  --   --  2.3 2.2  PHOS  --   --  4.0  --   --   --     Liver Function Tests: Recent Labs  Lab 10/26/17 1032  AST 46*  ALT 51  ALKPHOS 96  BILITOT 1.2  PROT 6.1*  ALBUMIN 3.6   No results for input(s): LIPASE, AMYLASE in the last 168 hours. No results for input(s): AMMONIA in the last  168 hours.  CBC: Recent Labs  Lab 10/26/17 1032 10/29/17 0556  WBC 8.9 5.5  NEUTROABS 6.4  --   HGB 14.4 12.8*  HCT 43.5 38.4*  MCV 99.1 98.2  PLT 189 138*    Cardiac Enzymes: Recent Labs  Lab 10/26/17 1040  TROPONINI <0.03    BNP: Invalid input(s): POCBNP  CBG: No results for input(s): GLUCAP in the last 168 hours.  Microbiology: No results found for this or any previous visit.  Coagulation Studies: No results for input(s): LABPROT, INR in the last 72 hours.  Urinalysis: Recent Labs    10/26/17 1216  COLORURINE YELLOW  LABSPEC 1.010  PHURINE 5.5  GLUCOSEU NEGATIVE  HGBUR NEGATIVE  BILIRUBINUR NEGATIVE  KETONESUR NEGATIVE  PROTEINUR NEGATIVE  NITRITE NEGATIVE  LEUKOCYTESUR NEGATIVE      Imaging: No results found.   Medications:   . sodium chloride     . amiodarone  200 mg Oral BID  . apixaban  2.5 mg Oral BID  . atorvastatin  80 mg Oral Daily  .  bisoprolol  5 mg Oral QHS  . digoxin  0.125 mg Oral Daily  . diltiazem  180 mg Oral Daily  . furosemide  40 mg Oral BID  . gabapentin  200 mg Oral QHS  . multivitamin with minerals  1 tablet Oral Daily  . pantoprazole  40 mg Oral Daily  . polyethylene glycol  17 g Oral Daily  . potassium chloride  20 mEq Oral BID  . sodium chloride flush  3 mL Intravenous Q12H  . tamsulosin  0.4 mg Oral QHS  . timolol  1 drop Both Eyes Daily  . cyanocobalamin  1,000 mcg Oral Daily   sodium chloride, acetaminophen, fluticasone, ondansetron (ZOFRAN) IV, sodium chloride flush, traMADol  Assessment/ Plan:  [name] is a 83 y.o. white male with hypertension, coronary artery disease, atrial fibrillation, congestive heart failure, BPH, who was admitted to [facility] on 10/26/2017  1. Acute Renal Failure on chronic kidney disease stage III: baseline creatinine 1.2 GFR of 57 08/28/17. Bland urine.  Chronic kidney disease secondary to hypertension Acute renal failure secondary to acute cardiorenal syndrome.  -  Transitioned furosemide to PO  2. Hypertension: with bradycardia, congestive heart failure and atrial fibrillation. Blood pressure at goal. Irregular rhythm on examination.  - bisoprolol, amiodarone and digoxin for rate control  Patient will need close follow up with Nephrology.    LOS: 3 Samuel Perry 11/8/201810:31 AM

## 2017-10-29 NOTE — Care Management (Signed)
Patient declined home health nurse follow up.  Requested order for resumption of home health physical therapy.  [facility] is aware

## 2017-10-29 NOTE — Consult Note (Signed)
Reason for Consult: Congestive heart failure atrial fibrillation Referring Physician: Dr. Posey Pronto hospitalist primary physician [facility] Cardiologist Dr. Oswald Hillock Samuel Perry is an 83 y.o. male.  HPI: Patient has a known history of congestive heart failure systolic dysfunction cardiomyopathy ejection fraction less than 15%.  Patient has known coronary disease.  States that he started having collection of fluid so he decided to come to the emergency room when she gets short of breath.  Patient thinks that he was collecting more fluid even on his medications patient denies any chest pain states to have been compliant with his medications.  Patient also developed atrial fibrillation which may have contributed to his heart failure symptoms.  Patient feels somewhat improved now after diuretic therapy  Past Medical History:  Diagnosis Date  . A-fib ([place])   . CHF (congestive heart failure) ([place])   . CKD (chronic kidney disease), stage II   . Coronary artery disease   . Hypertension     History reviewed. No pertinent surgical history.  Family History  Problem Relation Age of Onset  . CAD Mother   . CAD Brother     Social History:  reports that  has never smoked. he has never used smokeless tobacco. He reports that he does not drink alcohol or use drugs.  Allergies: No Known Allergies  Medications: I have reviewed the patient's current medications.  Results for orders placed or performed during the hospital encounter of 10/26/17 (from the past 48 hour(s))  Potassium     Status: None   Collection Time: 10/27/17  6:11 PM  Result Value Ref Range   Potassium 4.3 3.5 - 5.1 mmol/L  Basic metabolic panel     Status: Abnormal   Collection Time: 10/28/17  3:57 AM  Result Value Ref Range   Sodium 141 135 - 145 mmol/L   Potassium 3.8 3.5 - 5.1 mmol/L   Chloride 105 101 - 111 mmol/L   CO2 29 22 - 32 mmol/L   Glucose, Bld 108 (H) 65 - 99 mg/dL   BUN 33 (H) 6 - 20 mg/dL   Creatinine, Ser 1.99  (H) 0.61 - 1.24 mg/dL   Calcium 8.5 (L) 8.9 - 10.3 mg/dL   GFR calc non Af Amer 28 (L) >60 mL/min   GFR calc Af Amer 33 (L) >60 mL/min    Comment: (NOTE) The eGFR has been calculated using the CKD EPI equation. This calculation has not been validated in all clinical situations. eGFR's persistently <60 mL/min signify possible Chronic Kidney Disease.    Anion gap 7 5 - 15  Magnesium     Status: None   Collection Time: 10/28/17  3:57 AM  Result Value Ref Range   Magnesium 2.3 1.7 - 2.4 mg/dL  Basic metabolic panel     Status: Abnormal   Collection Time: 10/29/17  5:56 AM  Result Value Ref Range   Sodium 140 135 - 145 mmol/L   Potassium 3.6 3.5 - 5.1 mmol/L   Chloride 102 101 - 111 mmol/L   CO2 29 22 - 32 mmol/L   Glucose, Bld 108 (H) 65 - 99 mg/dL   BUN 32 (H) 6 - 20 mg/dL   Creatinine, Ser 2.05 (H) 0.61 - 1.24 mg/dL   Calcium 8.6 (L) 8.9 - 10.3 mg/dL   GFR calc non Af Amer 27 (L) >60 mL/min   GFR calc Af Amer 32 (L) >60 mL/min    Comment: (NOTE) The eGFR has been calculated using the CKD EPI  equation. This calculation has not been validated in all clinical situations. eGFR's persistently <60 mL/min signify possible Chronic Kidney Disease.    Anion gap 9 5 - 15  CBC     Status: Abnormal   Collection Time: 10/29/17  5:56 AM  Result Value Ref Range   WBC 5.5 3.8 - 10.6 K/uL   RBC 3.91 (L) 4.40 - 5.90 MIL/uL   Hemoglobin 12.8 (L) 13.0 - 18.0 g/dL   HCT 38.4 (L) 40.0 - 52.0 %   MCV 98.2 80.0 - 100.0 fL   MCH 32.8 26.0 - 34.0 pg   MCHC 33.4 32.0 - 36.0 g/dL   RDW 14.2 11.5 - 14.5 %   Platelets 138 (L) 150 - 440 K/uL  Magnesium     Status: None   Collection Time: 10/29/17  5:56 AM  Result Value Ref Range   Magnesium 2.2 1.7 - 2.4 mg/dL    No results found.  Review of Systems  Constitutional: Positive for malaise/fatigue.  HENT: Positive for congestion.   Eyes: Negative.   Respiratory: Positive for cough and shortness of breath.   Cardiovascular: Positive for  orthopnea, leg swelling and PND.  Gastrointestinal: Negative.   Genitourinary: Negative.   Musculoskeletal: Negative.   Skin: Negative.   Neurological: Positive for weakness.  Endo/Heme/Allergies: Negative.   Psychiatric/Behavioral: Negative.    Blood pressure 100/76, pulse 72, temperature 97.6 F (36.4 C), temperature source Oral, resp. rate 15, height 5' 11"  (1.803 m), weight 187 lb 11.2 oz (85.1 kg), SpO2 93 %. Physical Exam  Nursing note and vitals reviewed. Constitutional: He is oriented to person, place, and time. He appears well-developed and well-nourished.  HENT:  Head: Normocephalic and atraumatic.  Eyes: Conjunctivae and EOM are normal. Pupils are equal, round, and reactive to light.  Neck: Normal range of motion. Neck supple.  Cardiovascular: Normal rate, S1 normal, S2 normal and normal pulses. An irregularly irregular rhythm present. Exam reveals gallop and S3.  Murmur heard.  Systolic murmur is present with a grade of 2/6. Respiratory: He has decreased breath sounds. He has rhonchi.  GI: Soft. Bowel sounds are normal.  Musculoskeletal: Normal range of motion.  Neurological: He is alert and oriented to person, place, and time. He has normal reflexes.  Skin: Skin is warm and dry.  Psychiatric: He has a normal mood and affect.    Assessment/Plan: Congestive heart failure Cardiomyopathy Atrial fibrillation Hypertension  former smoker Shortness of breath Chronic renal insufficiency Hypokalemia . Agree with admission to telemetry Command diuretics intravenously with Lasix 40 mg Continue beta-blockade therapy We will consider starting digoxin for heart failure as well as atrial fibrillation Cardizem for rate control for atrial fibrillation Patient on anticoagulation with Eliquis for atrial fibrillation Hypertension management [place] Maintain amiodarone therapy for atrial fibrillation Consult nephrology for renal insufficiency Consider imdur/ hydralazine for  heart failure Consider Entresto for heart failure Stace Peace D Zamir Staples 10/29/2017, 12:56 PM

## 2017-10-30 ENCOUNTER — Other Ambulatory Visit: Payer: Self-pay | Admitting: *Deleted

## 2017-10-30 ENCOUNTER — Ambulatory Visit: Payer: PPO | Admitting: Family

## 2017-10-30 DIAGNOSIS — E785 Hyperlipidemia, unspecified: Secondary | ICD-10-CM | POA: Diagnosis not present

## 2017-10-30 DIAGNOSIS — I110 Hypertensive heart disease with heart failure: Secondary | ICD-10-CM | POA: Diagnosis not present

## 2017-10-30 DIAGNOSIS — I2510 Atherosclerotic heart disease of native coronary artery without angina pectoris: Secondary | ICD-10-CM | POA: Diagnosis not present

## 2017-10-30 DIAGNOSIS — I5021 Acute systolic (congestive) heart failure: Secondary | ICD-10-CM | POA: Diagnosis not present

## 2017-10-30 DIAGNOSIS — I480 Paroxysmal atrial fibrillation: Secondary | ICD-10-CM | POA: Diagnosis not present

## 2017-10-30 DIAGNOSIS — I429 Cardiomyopathy, unspecified: Secondary | ICD-10-CM | POA: Diagnosis not present

## 2017-10-30 NOTE — Patient Outreach (Signed)
Successful telephone encounter for Samuel Perry, 83 year old male for transition of care/recent hospitalization November 5-8,2018 for acute CHF, acute kidney injury.  This RN CM was following pt for transition of care/hospital stay October 24-28,2018 for CHF, Atrial fib- original referral received from [facility] hospital liaison 10/19/17,verbal consent  Obtained.   Pt's history includes but not limited to Cardiomyopathy, Hypertension, CKD stage III, Lymphedema.   Spoke with spouse Samuel Perry, HIPAA identifiers provided on pt, reports pt  just got home yesterday, weighing daily, still has some swelling in stomach/ankle.   Spouse reports has not observed pt having sob ambulating as he has not been walking much- bathroom and back.  Spouse reports they declined Home health as pt has so many MD appointments to go to.  Spouse reports pt taking all of his medications per discharge instructions, she continues to administer, RN CM did not review with spouse as she was in a hurry to get off the phone.   RN CM reviewed  with spouse [facility] transition of care program- weekly calls, a home visit as she had questions about Community CM services    Plan:  As discussed with pt's spouse, plan to follow up again next week- initial home visit.   Samuel Perry.   [place] [facility]  [phone]

## 2017-11-02 DIAGNOSIS — N179 Acute kidney failure, unspecified: Secondary | ICD-10-CM | POA: Diagnosis not present

## 2017-11-02 DIAGNOSIS — N183 Chronic kidney disease, stage 3 (moderate): Secondary | ICD-10-CM | POA: Diagnosis not present

## 2017-11-02 DIAGNOSIS — E876 Hypokalemia: Secondary | ICD-10-CM | POA: Diagnosis not present

## 2017-11-02 DIAGNOSIS — I129 Hypertensive chronic kidney disease with stage 1 through stage 4 chronic kidney disease, or unspecified chronic kidney disease: Secondary | ICD-10-CM | POA: Diagnosis not present

## 2017-11-03 DIAGNOSIS — I5021 Acute systolic (congestive) heart failure: Secondary | ICD-10-CM | POA: Diagnosis not present

## 2017-11-03 DIAGNOSIS — I779 Disorder of arteries and arterioles, unspecified: Secondary | ICD-10-CM | POA: Diagnosis not present

## 2017-11-03 DIAGNOSIS — E782 Mixed hyperlipidemia: Secondary | ICD-10-CM | POA: Diagnosis not present

## 2017-11-03 DIAGNOSIS — I6523 Occlusion and stenosis of bilateral carotid arteries: Secondary | ICD-10-CM | POA: Diagnosis not present

## 2017-11-03 DIAGNOSIS — I480 Paroxysmal atrial fibrillation: Secondary | ICD-10-CM | POA: Diagnosis not present

## 2017-11-03 DIAGNOSIS — I2510 Atherosclerotic heart disease of native coronary artery without angina pectoris: Secondary | ICD-10-CM | POA: Diagnosis not present

## 2017-11-03 DIAGNOSIS — I10 Essential (primary) hypertension: Secondary | ICD-10-CM | POA: Diagnosis not present

## 2017-11-03 DIAGNOSIS — R0602 Shortness of breath: Secondary | ICD-10-CM | POA: Diagnosis not present

## 2017-11-04 ENCOUNTER — Other Ambulatory Visit: Payer: Self-pay | Admitting: Nephrology

## 2017-11-04 ENCOUNTER — Encounter: Payer: Self-pay | Admitting: *Deleted

## 2017-11-04 ENCOUNTER — Ambulatory Visit: Payer: Self-pay | Admitting: *Deleted

## 2017-11-04 ENCOUNTER — Other Ambulatory Visit: Payer: Self-pay | Admitting: *Deleted

## 2017-11-04 DIAGNOSIS — N179 Acute kidney failure, unspecified: Secondary | ICD-10-CM

## 2017-11-04 DIAGNOSIS — N183 Chronic kidney disease, stage 3 unspecified: Secondary | ICD-10-CM

## 2017-11-04 NOTE — Patient Outreach (Signed)
[facility]) Care Management   25/[id number]/[id number]  Mayank Teuscher [facility] [birth year] [id number]  Samuel Perry is an 83 y.o. male  Subjective:  Spouse reports on pt's recent hospitalization, called Heart MD prior  To admission edema not going down/fluid med not working, pt told to go to ED/ Resulting in admission.  Pt reports lost 5 lbs since discharge home, 186 lbs at  Discharge, today 181.6 lbs/weighing daily.   Pt reports has some sob on exertion,  Main issue is weakness in legs, goal is to get strength back/HH PT coming today (first visit), wants to return to golfing.  Pt reports saw Heart MD and his PA, to go  To HF clinic tomorrow.   Objective:   Vitals:   11/04/17 1131  BP: 110/60  Pulse: 95  Resp: 16  SpO2: 96%    ROS  Physical Exam  Constitutional: He is oriented to person, place, and time. He appears well-developed and well-nourished.  Cardiovascular:  HR irregular, hx of A fib.   Respiratory: Effort normal and breath sounds normal.  Musculoskeletal: Normal range of motion. He exhibits edema.  +1 top of left foot, trace top of right foot   Neurological: He is alert and oriented to person, place, and time.  Skin: Skin is warm and dry.  Psychiatric: He has a normal mood and affect. His behavior is normal. Judgment and thought content normal.    Encounter Medications:   Outpatient Encounter Medications as of 11/04/2017  Medication Sig Note  . amiodarone (PACERONE) 100 MG tablet Take 200 mg by mouth 2 (two) times daily.   Marland Kitchen apixaban (ELIQUIS) 2.5 MG TABS tablet Take 1 tablet (2.5 mg total) by mouth 2 (two) times daily.   Marland Kitchen atorvastatin (LIPITOR) 80 MG tablet Take 80 mg by mouth daily.   . bisoprolol (ZEBETA) 5 MG tablet Take 1 tablet (5 mg total) by mouth at bedtime.   . cyanocobalamin 1000 MCG tablet Take 1,000 mcg by mouth daily.   . digoxin (LANOXIN) 0.125 MG tablet Take 1 tablet (0.125 mg total) daily by mouth.   . diltiazem (CARDIZEM CD) 180 MG 24 hr capsule  Take 1 capsule (180 mg total) by mouth daily.   . fluticasone (FLONASE) 50 MCG/ACT nasal spray Place 2 sprays into both nostrils daily.   . furosemide (LASIX) 40 MG tablet Take 1 tablet (40 mg total) 2 (two) times daily by mouth.   . gabapentin (NEURONTIN) 300 MG capsule Take 200 mg at bedtime by mouth.    Marland Kitchen MAGNESIUM SULFATE PO Take 500 mg by mouth as needed.   . Multiple Vitamins-Minerals (MULTIVITAMIN WITH MINERALS) tablet Take 1 tablet by mouth daily.   Marland Kitchen omeprazole (PRILOSEC) 40 MG capsule Take 40 mg by mouth daily.   . ondansetron (ZOFRAN) 4 MG tablet Take 1 tablet (4 mg total) every 8 (eight) hours as needed by mouth for nausea or vomiting.   . potassium chloride SA (K-DUR,KLOR-CON) 20 MEQ tablet Take 20 mEq by mouth 2 (two) times daily.   . tamsulosin (FLOMAX) 0.4 MG CAPS capsule Take 0.4 mg at bedtime by mouth. 30 minutes after same daily meal    . timolol (BETIMOL) 0.5 % ophthalmic solution Place 1 drop into both eyes daily.   . traMADol (ULTRAM) 50 MG tablet Take 50 mg by mouth every 6 (six) hours as needed. 10/19/2017: As needed.    No facility-administered encounter medications on file as of 11/04/2017.     Functional Status:   In  your present state of health, do you have any difficulty performing the following activities: 11/04/2017 10/26/2017  Hearing? Y N  Vision? N N  Difficulty concentrating or making decisions? N N  Walking or climbing stairs? Y N  Dressing or bathing? N N  Doing errands, shopping? Y N  Preparing Food and eating ? Y -  Using the Toilet? N -  In the past six months, have you accidently leaked urine? N -  Do you have problems with loss of bowel control? N -  Managing your Medications? Y -  Managing your Finances? N -  Housekeeping or managing your Housekeeping? Y -  Some recent data might be hidden    Fall/Depression Screening:    Fall Risk  11/04/2017 10/22/2017  Falls in the past year? Yes Yes  Number falls in past yr: 1 2 or more  Injury with  Fall? Yes Yes  Risk for fall due to : - Impaired balance/gait   PHQ 2/9 Scores 11/04/2017 10/22/2017  PHQ - 2 Score 0 0    Assessment:  Pleasant 83 year old male, resides with spouse/very supportive/present during home visit.  This RN CM following pt for transition of care/recent hospitalization  November 5-8,2018 for acute CHF,acute kidney injury.  Previous hospital stay- October 24-28,2018 for CHF,Atrial fib.    CHF:  Per pt weight today 181.6 lbs, at discharge 11/8- 186 lbs.  Lungs clear, no complaints of sob, chest pain.  +1 edema told of left foot,trace top of right foot.    Weakness in legs:  Noted pt unsteady at first getting out of chair, able to walk without       Assistance.  Per pt, HH PT to come today (first visit).    Plan: As discussed with pt, RN CM coworkers will be  covering for this RN CM the next 2 weeks,to do weekly transition of care calls.   RN CM provided pt with [facility]'s main office number and [facility] [employer] 24 hour nurse line to call if needed.           Plan to send Dr. Doy Hutching 11/04/17 home visit encounter.   [facility] CM Care Plan Problem One     Most Recent Value  Care Plan Problem One  Risk for readmisson related to recent hospitalization for acute CHF, acute kidney injury (2 admits <30 days.   Role Documenting the Problem One  Care Management Coordinator  Care Plan for Problem One  Active  [facility] Long Term Goal   Pt will not readmit to the hospital within the next 31 days   [facility] Long Term Goal Start Date  10/30/17  Interventions for Problem One Long Term Goal  Provided pt with Living with HF booklet, reviewed HF zones/special attention to [facility] zone s/s when to call MD   [facility] CM Short Term Goal #1   Pt would keep all MD appointments in the next 30 days   [facility] CM Short Term Goal #1 Start Date  10/30/17  Interventions for Short Term Goal #1  Discussed with pt/spouse recent visit with Heart MD's PA- medication changes.   [facility] CM Short Term Goal #2   Pt would not a  weight gain of 2-3 lbs overnight, 5 lbs in a week in the next 30 days   [facility] CM Short Term Goal #2 Start Date  10/30/17  Interventions for Short Term Goal #2  Discussed with pt recent weight- per pt lost 5 lbs since discharge home/taking medications as  ordered.   [facility] CM Short Term Goal #3  Pt would have strength in legs back within the next 21 days   [facility] CM Short Term Goal #3 Start Date  11/04/17  Interventions for Short Tern Goal #3  Discussed with pt working with HHPT- per pt to come today     Zara Chess.   [place] [facility] Management  [phone]

## 2017-11-05 ENCOUNTER — Other Ambulatory Visit: Payer: Self-pay | Admitting: *Deleted

## 2017-11-05 ENCOUNTER — Encounter: Payer: Self-pay | Admitting: Family

## 2017-11-05 ENCOUNTER — Other Ambulatory Visit: Payer: Self-pay

## 2017-11-05 ENCOUNTER — Ambulatory Visit: Payer: PPO | Attending: Family | Admitting: Family

## 2017-11-05 VITALS — BP 123/76 | HR 94 | Resp 18 | Ht 71.0 in | Wt 182.1 lb

## 2017-11-05 DIAGNOSIS — I2510 Atherosclerotic heart disease of native coronary artery without angina pectoris: Secondary | ICD-10-CM | POA: Insufficient documentation

## 2017-11-05 DIAGNOSIS — I4891 Unspecified atrial fibrillation: Secondary | ICD-10-CM | POA: Diagnosis not present

## 2017-11-05 DIAGNOSIS — N182 Chronic kidney disease, stage 2 (mild): Secondary | ICD-10-CM | POA: Diagnosis not present

## 2017-11-05 DIAGNOSIS — I890 Lymphedema, not elsewhere classified: Secondary | ICD-10-CM

## 2017-11-05 DIAGNOSIS — Z79899 Other long term (current) drug therapy: Secondary | ICD-10-CM | POA: Diagnosis not present

## 2017-11-05 DIAGNOSIS — I5022 Chronic systolic (congestive) heart failure: Secondary | ICD-10-CM

## 2017-11-05 DIAGNOSIS — I10 Essential (primary) hypertension: Secondary | ICD-10-CM

## 2017-11-05 DIAGNOSIS — Z7901 Long term (current) use of anticoagulants: Secondary | ICD-10-CM | POA: Diagnosis not present

## 2017-11-05 DIAGNOSIS — I130 Hypertensive heart and chronic kidney disease with heart failure and stage 1 through stage 4 chronic kidney disease, or unspecified chronic kidney disease: Secondary | ICD-10-CM | POA: Diagnosis not present

## 2017-11-05 DIAGNOSIS — N183 Chronic kidney disease, stage 3 unspecified: Secondary | ICD-10-CM

## 2017-11-05 DIAGNOSIS — R0602 Shortness of breath: Secondary | ICD-10-CM | POA: Diagnosis present

## 2017-11-05 NOTE — Progress Notes (Signed)
Patient ID: Samuel Perry, male    DOB: [birth year], 83 y.o.   MRN: [id number]  HPI  Samuel Perry is a 83 y/o male with a history of atrial fibrillation, CAD, HTN, CKD and chronic heart failure.   Echo done 10/13/17 reviewed and shows an EF of 15% along with moderate Samuel/TR.   Admitted 10/26/17 due to HF exacerbation. Cardiology & nephrology consults were obtained. He was discharged after 3 days.Admitted 10/14/17 due to HF and AF/RVR. Initially needed IV diuretics and then transitioned to oral diuretics. Cardiology consult was obtained. Amiodarone drip was started and then switched back to his usual oral amiodarone dose. Discharged home after 4 days.   Patient presents for a follow-up visit with a chief complaint of moderate shortness of breath upon minimal exertion. He describes this as chronic in nature having been present for several months with varying levels of severity. He has associated fatigue, cough, edema, abdominal distention and light-headedness along with this. He denies chest pain, palpitations, difficulty sleeping or weight gain.   Past Medical History:  Diagnosis Date  . A-fib ([place])   . CHF (congestive heart failure) ([place])   . CKD (chronic kidney disease), stage II   . Coronary artery disease   . Hypertension    Past Surgical History:  Procedure Laterality Date  . JOINT REPLACEMENT     Family History  Problem Relation Age of Onset  . CAD Mother   . CAD Brother    Social History   Tobacco Use  . Smoking status: Never Smoker  . Smokeless tobacco: Never Used  Substance Use Topics  . Alcohol use: No   No Known Allergies  Prior to Admission medications   Medication Sig Start Date End Date Taking? Authorizing Provider  amiodarone (PACERONE) 100 MG tablet Take 200 mg by mouth 2 (two) times daily.   Yes [provider]  apixaban (ELIQUIS) 2.5 MG TABS tablet Take 1 tablet (2.5 mg total) by mouth 2 (two) times daily. 10/18/17  Yes Wieting, Richard, MD  atorvastatin  (LIPITOR) 80 MG tablet Take 80 mg by mouth daily.   Yes [provider]  bisoprolol (ZEBETA) 5 MG tablet Take 10 mg 2 (two) times daily by mouth.   Yes [provider]  cyanocobalamin 1000 MCG tablet Take 1,000 mcg by mouth daily.   Yes [provider]  digoxin (LANOXIN) 0.125 MG tablet Take 1 tablet (0.125 mg total) daily by mouth. 10/29/17  Yes Dustin Flock, MD  fluticasone (FLONASE) 50 MCG/ACT nasal spray Place 2 sprays into both nostrils daily.   Yes [provider]  furosemide (LASIX) 40 MG tablet Take 1 tablet (40 mg total) 2 (two) times daily by mouth. 10/29/17  Yes Dustin Flock, MD  gabapentin (NEURONTIN) 300 MG capsule Take 200 mg at bedtime by mouth.    Yes [provider]  LATANOPROST OP Apply to eye. One drop both eyes at night.   Yes [provider]  MAGNESIUM SULFATE PO Take 500 mg by mouth as needed.   Yes [provider]  Multiple Vitamins-Minerals (MULTIVITAMIN WITH MINERALS) tablet Take 1 tablet by mouth daily.   Yes [provider]  omeprazole (PRILOSEC) 40 MG capsule Take 40 mg by mouth daily.   Yes [provider]  ondansetron (ZOFRAN) 4 MG tablet Take 1 tablet (4 mg total) every 8 (eight) hours as needed by mouth for nausea or vomiting. 10/29/17  Yes Dustin Flock, MD  potassium chloride SA (K-DUR,KLOR-CON) 20 MEQ tablet  Take 20 mEq by mouth 2 (two) times daily.   Yes [provider]  tamsulosin (FLOMAX) 0.4 MG CAPS capsule Take 0.4 mg at bedtime by mouth. 30 minutes after same daily meal    Yes [provider]  timolol (BETIMOL) 0.5 % ophthalmic solution Place 1 drop into both eyes daily.   Yes [provider]  traMADol (ULTRAM) 50 MG tablet Take 50 mg by mouth every 6 (six) hours as needed.   Yes [provider]    Review of Systems  Constitutional: Positive for fatigue. Negative for appetite change.  HENT: Negative for congestion, postnasal drip and  sore throat.   Eyes: Negative.   Respiratory: Positive for cough and shortness of breath. Negative for chest tightness.   Cardiovascular: Positive for leg swelling (L>R). Negative for chest pain and palpitations.  Gastrointestinal: Positive for abdominal distention. Negative for abdominal pain.  Endocrine: Negative.   Genitourinary: Negative.   Musculoskeletal: Negative.   Skin: Negative.   Allergic/Immunologic: Negative.   Neurological: Positive for light-headedness. Negative for dizziness.  Hematological: Negative for adenopathy. Bruises/bleeds easily.  Psychiatric/Behavioral: Negative for dysphoric mood and sleep disturbance (sleeping well, + snoring). The patient is not nervous/anxious.    Vitals:   11/05/17 1218  BP: 123/76  Pulse: 94  Resp: 18  SpO2: 98%  Weight: 182 lb 2 oz (82.6 kg)  Height: 5\' 11"  (1.803 m)   Wt Readings from Last 3 Encounters:  11/05/17 182 lb 2 oz (82.6 kg)  11/04/17 181 lb 9.6 oz (82.4 kg)  10/29/17 187 lb 11.2 oz (85.1 kg)    Lab Results  Component Value Date   CREATININE 2.05 (H) 10/29/2017   CREATININE 1.99 (H) 10/28/2017   CREATININE 2.08 (H) 10/27/2017    Physical Exam  Constitutional: He is oriented to person, place, and time. He appears well-developed and well-nourished.  HENT:  Head: Normocephalic and atraumatic.  Neck: Normal range of motion. Neck supple. No JVD present.  Cardiovascular: Normal rate. An irregular rhythm present.  Pulmonary/Chest: Effort normal. He has no wheezes. He has no rales.  Abdominal: Soft. He exhibits no distension. There is no tenderness.  Musculoskeletal: He exhibits edema (2+ pitting edema in bilateral lower legs with L>R). He exhibits no tenderness.  Neurological: He is alert and oriented to person, place, and time.  Skin: Skin is warm and dry.  Psychiatric: He has a normal mood and affect. His behavior is normal. Thought content normal.  Nursing note and vitals reviewed.   Assessment & Plan:  1:  Chronic heart failure with reduced ejection fraction- - NYHA class III - mildly fluid overloaded today - weighing daily at home and says that his weight has gradually decreased. Reminded to call for an overnight weight gain of >2 pounds or a weekly weight gain of >5 pounds - weight down 10 pounds since he was last here - not adding salt and they have been trying to read food labels. Reminded to closely follow a 2000mg  sodium diet as closely as possible - diltiazem has been stopped with titration of bisoprolol. Those changes were made 2 days ago - recommend having metolazone on hand in case his weight rises but he would like to discuss with his cardiologist next week; made aware that his renal function would have to be monitored closely - could consider entresto depending on renal function - saw cardiology Lyndel Pleasure) 11/03/17 - patient reports receiving his flu vaccine for this season already  2: Atrial fibrillation- - HR irregular  -  currently on amiodarone and bisoprolol along with apixaban  3: HTN- - BP looks good today - saw PCP ([place]) 10/20/17  4: CKD- - wife & patient aware that if above metolazone is used, that his renal function could decline short-term.  - BMP from 10/29/17 reviewed and shows sodium 140, potassium 3.6 and GFR 27 - says that he had lab work drawn by nephrology Juleen China) on 11/02/17; will request those results  5: Lymphedema- - stage 2 - has been wearing support socks and says that the edema has persisted - has been elevating his legs as well but without resolution of edema - limited in his ability to exercise due to shortness of breath - will make a referral for lymphapress compression boots  Medication list was reviewed.  Patient opts to not return at this time as he says that he's being followed closely by cardiology.

## 2017-11-05 NOTE — Patient Instructions (Signed)
Continue weighing daily and call for an overnight weight gain of > 2 pounds or a weekly weight gain of >5 pounds. 

## 2017-11-05 NOTE — Patient Outreach (Signed)
Pt presented today for [facility]  difficult case discussion- 2 admits within 30 days with most recent hospitalization November 5-7,2018 for CHF, previous admit October 24-28,2018 for Atrial fib, CHF.   This RN CM following pt for transition of care,  initial home visit done with pt  yesterday, no sob/chest pain,weighing- loosing weight, current diuretic working, main issue is getting strength back in legs- HH PT was to come yesterday.    Pt relayed to this RN CM wants to get back to golfing.   Pt relayed did follow up with Heart MD and PA, to go to HF clinic today.  This RN CM provided pt with Living with HF booklet, reviewed HF zones, low Na+ diet/reading food labels for sodium content.     Plan:  This RN CM to continue to follow pt for transition of care.    Zara Chess.   [place] Care Management  [phone]

## 2017-11-09 ENCOUNTER — Ambulatory Visit
Admission: RE | Admit: 2017-11-09 | Discharge: 2017-11-09 | Disposition: A | Discharge status: Home / Self Care | Payer: PPO | Source: Ambulatory Visit | Attending: Nephrology | Admitting: Nephrology

## 2017-11-09 DIAGNOSIS — N183 Chronic kidney disease, stage 3 unspecified: Secondary | ICD-10-CM

## 2017-11-09 DIAGNOSIS — N179 Acute kidney failure, unspecified: Secondary | ICD-10-CM | POA: Insufficient documentation

## 2017-11-10 DIAGNOSIS — I2510 Atherosclerotic heart disease of native coronary artery without angina pectoris: Secondary | ICD-10-CM | POA: Diagnosis not present

## 2017-11-10 DIAGNOSIS — I480 Paroxysmal atrial fibrillation: Secondary | ICD-10-CM | POA: Diagnosis not present

## 2017-11-10 DIAGNOSIS — I5022 Chronic systolic (congestive) heart failure: Secondary | ICD-10-CM | POA: Diagnosis not present

## 2017-11-10 DIAGNOSIS — R0602 Shortness of breath: Secondary | ICD-10-CM | POA: Diagnosis not present

## 2017-11-10 DIAGNOSIS — I481 Persistent atrial fibrillation: Secondary | ICD-10-CM | POA: Diagnosis not present

## 2017-11-10 DIAGNOSIS — N183 Chronic kidney disease, stage 3 (moderate): Secondary | ICD-10-CM | POA: Diagnosis not present

## 2017-11-11 ENCOUNTER — Other Ambulatory Visit: Payer: Self-pay | Admitting: *Deleted

## 2017-11-11 NOTE — Patient Outreach (Signed)
[facility]) Care Management  03/00/9233  Ansel Ferrall [facility] [birth year] [phone]  Transition of care note   Successful telephone outreach to patient with recent hospital admission for Heart failure, PMHx includes but not limited to HTN, CKD stage 3, Lymphedema, .   Patient reports he is feeling pretty good, he discussed his recent visit to [facility] , cardiologist stating his lungs are clear and doctor pleased with his fluid weight loss and has follow up appointment in the next 2 weeks , 12/5.  Patient also discussed visit to Heart Failure clinic and recommendation for Lymphapress boots to help with swelling in legs. Patient discussed he has received a call from company and he will need to make a follow up appointment with Darylene Price at Heart Failure clinic to complete process, he states his wife will do that.   Patient states his weight today is 168.6  and he as lost close to 20 lbs in the month. Patient discussed legs not being as swollen but right foot with swelling.some improvement with shortness of breath with activity. Patient discussed taking his fluid pill and potassium twice daily.  Patient continues to wear compression socks and elevate legs some during the day. .  Patient discussed his wife trying to keep the salt in diet down and they never add salt to diet.  Patient continues to work with home health physical therapy that has visited twice this week.  Patient denies any new concerns at this time.   Plan  Patient will continue to receive weekly transition of care outreaches, reminded patient that coworker covering for [facility] RNCM will call him on next week.  Verified patient has [facility] main number and 24  Hour nurse line number.    Joylene Draft, RN, [place] Management Coordinator  [phone]- Mobile [phone]- Toll Free Main Office

## 2017-11-17 ENCOUNTER — Other Ambulatory Visit: Payer: Self-pay | Admitting: *Deleted

## 2017-11-17 ENCOUNTER — Encounter: Payer: Self-pay | Admitting: *Deleted

## 2017-11-17 NOTE — Patient Outreach (Signed)
[facility]) Care Management [place], Transition of Care day 19  16/09/9603  Cejay Cambre [facility] [birth year] [phone]  Successful telephone outreach to [employer], spouse/ primary caregiver, on [facility] CM written consent, for Samuel Perry, 83 y/o male referred to [facility] for multiple recent hospitalizations for CHF/ A-fib.  Patient was most recently discharged from hospital October 29, 2017 with home health services for PT through [facility].  Patient has history including, but not limited to CHF, A-Fib, HTN, cardiomyopathy, and CKD.  HIPAA/ identity verified with patient's spouse during phone call today.  I explained to patient's spouse that I was contacting patient for primary [facility] RN CM Kathie Rhodes).  Today, Mrs. Landess reports that patient is napping and unavailable to speak with me on the phone today; states patient "slept really well last night," but stated that at some point this morning after wakening, he had "an episode with his arm" where his arm started "shaking uncontrollably," which caused him to drop his urinal; patient's wife stated that this episode spontaneously resolved, and that patient "seemed fine" afterward.  Ms. Handel also reported that patient has had ongoing nausea since his hospital discharge, but she denies that patient is in pain, having SOB or difficulty around breathing status.  Patient's spouse further reports:  -- has and is taking all medications as prescribed; reports that she contacted cardiology office yesterday regarding patient's ongoing nausea, and that the PA "stopped the digoxin;" reports patient to attend follow up cardiology office visit with Dr. Nehemiah Massed on Monday 11/23/17 at 11:00 am, and reports she will drive patient to this scheduled office visit.  Spouse denies further questions/ concerns around medications/ overall plan of care.  -- Patient continues actively participating in home health [facility]) PT;  states [facility] PT has not yet visited patient this week, and verbalizes hopes that [facility] sessions have not ended stating that she believes HH PT "has really helped."  I explained to patient's spouse that [facility] PT should make patient aware of time limitations on [facility] orders post-hospital discharge, and encouraged her to contact [facility] PT soon if she doesn't hear from PT first.  Explained that [facility] PT can work with patient's medical providers to obtain an extension of home health orders, if indicated; spouse very appreciative of this information, and confirms that she has the direct phone number for [facility] PT, and will call for scheduled visit if she doesn't hear from [facility] PT "soon."  -- patient has continued monitoring and recording daily weights; reports patient weight this morning of 162 lbs, noting that patient "has continued losing weight."  Reiterated signs/ symptoms CHF yellow zone, and encouraged patient's spouse to continue keeping in close contact with cardiology provider for any concerns; encouraged her to have patient take his recorded daily weights to cardiology office visit scheduled on Monday 11/23/17.  Patient's spouse denies that patient has had recent issues/ concerns around breathing status, stating that patient "seems to be breathing fine."  Patient's spouse denies further issues, concerns, or problems around patient's status today.  I provided spouse with my direct phone number, and confirmed that she has the main [facility] CM office phone number, and the [facility] CM 24-hour nurse advice phone number should issues arise prior to next scheduled [place] outreach, explaining that Kalman Shan) would contact patient again next week for ongoing transition of care; patient's spouse verbalized understanding and agreement with this plan.  Plan:  Will update patient's primary [facility] RN CM  of today's successful telephone outreach to patient for ongoing transition of care  [facility] CM Care Plan Problem One     Most Recent Value  Care  Plan Problem One  Risk for readmisson related to recent hospitalization for acute CHF, acute kidney injury (2 admits <30 days.   Role Documenting the Problem One  Care Management Coordinator  Care Plan for Problem One  Active  [facility] Long Term Goal   Pt will not readmit to the hospital within the next 31 days   [facility] Long Term Goal Start Date  10/30/17  Interventions for Problem One Long Term Goal  Obtained clinical status update from patient's spouse,  discussed patient's ongoing nausea and recent discontinuation of digoxin 11/16/17 by cardiology provider,  confirmed that patient will attend cardiology office visit scheduled for Monday 11/23/17,  encouraged patient's spouse to continue keeping in close contact with providers for any new concerns that arise  [facility] CM Short Term Goal #1   Pt would keep all MD appointments in the next 30 days   [facility] CM Short Term Goal #1 Start Date  10/30/17  Interventions for Short Term Goal #1  Reviewed with patient's spouse recent phone call to cardiology cardiology provider regarding patient's ongoing nausea,  discussed plan of care to discontinue digoxin and to follow up with office visit on 11/23/17   [facility] CM Short Term Goal #2   Pt would not a weight gain of 2-3 lbs overnight, 5 lbs in a week in the next 30 days   [facility] CM Short Term Goal #2 Start Date  10/30/17  Interventions for Short Term Goal #2  Reviewed current recorded daily weights with patient's spouse and encouraged her to have patient continue monitor/ record daily weights and to take weights to ofice visits with him,  reinforced signs/ symptoms of yellow CHF zone and encouraged patient's spouse to continue promptly notify providers for new concerns   [facility] CM Short Term Goal #3  Pt would have strength in legs back within the next 21 days   [facility] CM Short Term Goal #3 Start Date  11/04/17  Interventions for Short Tern Goal #3  Discussed current home health PT sessions with patient's spouse,  discussed process for  obtaining extension of home health services, and encouraged her to contact home health PT if she does not hear from PT soon with scheduled visit for this week     Oneta Rack, RN, BSN, [employer] Coordinator [facility] Care Management  [phone]

## 2017-11-23 DIAGNOSIS — I6523 Occlusion and stenosis of bilateral carotid arteries: Secondary | ICD-10-CM | POA: Diagnosis not present

## 2017-11-23 DIAGNOSIS — I779 Disorder of arteries and arterioles, unspecified: Secondary | ICD-10-CM | POA: Diagnosis not present

## 2017-11-23 DIAGNOSIS — I10 Essential (primary) hypertension: Secondary | ICD-10-CM | POA: Diagnosis not present

## 2017-11-23 DIAGNOSIS — I481 Persistent atrial fibrillation: Secondary | ICD-10-CM | POA: Diagnosis not present

## 2017-11-23 DIAGNOSIS — I429 Cardiomyopathy, unspecified: Secondary | ICD-10-CM | POA: Diagnosis not present

## 2017-11-23 DIAGNOSIS — I2510 Atherosclerotic heart disease of native coronary artery without angina pectoris: Secondary | ICD-10-CM | POA: Diagnosis not present

## 2017-11-23 DIAGNOSIS — E782 Mixed hyperlipidemia: Secondary | ICD-10-CM | POA: Diagnosis not present

## 2017-11-23 DIAGNOSIS — R0602 Shortness of breath: Secondary | ICD-10-CM | POA: Diagnosis not present

## 2017-11-24 DIAGNOSIS — I429 Cardiomyopathy, unspecified: Secondary | ICD-10-CM | POA: Diagnosis not present

## 2017-11-24 DIAGNOSIS — I2510 Atherosclerotic heart disease of native coronary artery without angina pectoris: Secondary | ICD-10-CM | POA: Diagnosis not present

## 2017-11-24 DIAGNOSIS — I110 Hypertensive heart disease with heart failure: Secondary | ICD-10-CM | POA: Diagnosis not present

## 2017-11-24 DIAGNOSIS — I5021 Acute systolic (congestive) heart failure: Secondary | ICD-10-CM | POA: Diagnosis not present

## 2017-11-24 DIAGNOSIS — I480 Paroxysmal atrial fibrillation: Secondary | ICD-10-CM | POA: Diagnosis not present

## 2017-11-24 DIAGNOSIS — E785 Hyperlipidemia, unspecified: Secondary | ICD-10-CM | POA: Diagnosis not present

## 2017-12-02 ENCOUNTER — Other Ambulatory Visit: Payer: Self-pay | Admitting: *Deleted

## 2017-12-02 NOTE — Patient Outreach (Signed)
Unsuccessful telephone encounter to Samuel Perry, 83 year old male for final transition of care call/recent hospitalization November 5-8,2018 for acute CHF, acute kidney injury.  View in EMR, 2 admits within 30 days- previous hospital stay October 24-28,2018 for CHF.    HIPAA compliant voice message left with contact name and number- request to return call.    Plan:  If no response to voice message left, plan to follow up again within next  2 days.    Zara Chess.   [place] Care Management  [phone]

## 2017-12-02 NOTE — Patient Outreach (Signed)
Received a return call from spouse(on consent form) to voice message left earlier by RN CM.   Spoke with spouse, HIPAA identifiers provided on pt, discussed purpose of call - transition of care/recent hospitalization (November 5-8,2018 for acute HF, acute kidney injury).   Spouse reports pt doing good with his  weights, yesterday was 165 lbs, today up 1/2 lb, no sob/chest pain/edema.  Spouse reports pt is still working with [facility] PT two times a week, pt getting stronger.  Pt reports pt taking all of his medications, to see Kidney MD tomorrow.   RN CM discussed with spouse  today final transition of care call for pt, plan to follow up again next month telephonically- check on clinical status.    Plan:  As discussed with spouse, plan to follow up again next month telephonically- check on pt's clinical status.    Zara Chess.   [place] Care Management  [phone]

## 2017-12-03 DIAGNOSIS — I129 Hypertensive chronic kidney disease with stage 1 through stage 4 chronic kidney disease, or unspecified chronic kidney disease: Secondary | ICD-10-CM | POA: Diagnosis not present

## 2017-12-03 DIAGNOSIS — E876 Hypokalemia: Secondary | ICD-10-CM | POA: Diagnosis not present

## 2017-12-03 DIAGNOSIS — N183 Chronic kidney disease, stage 3 (moderate): Secondary | ICD-10-CM | POA: Diagnosis not present

## 2017-12-04 ENCOUNTER — Ambulatory Visit: Payer: Self-pay | Admitting: *Deleted

## 2017-12-09 DIAGNOSIS — I2510 Atherosclerotic heart disease of native coronary artery without angina pectoris: Secondary | ICD-10-CM | POA: Diagnosis not present

## 2017-12-09 DIAGNOSIS — I480 Paroxysmal atrial fibrillation: Secondary | ICD-10-CM | POA: Diagnosis not present

## 2017-12-09 DIAGNOSIS — E785 Hyperlipidemia, unspecified: Secondary | ICD-10-CM | POA: Diagnosis not present

## 2017-12-09 DIAGNOSIS — I110 Hypertensive heart disease with heart failure: Secondary | ICD-10-CM | POA: Diagnosis not present

## 2017-12-09 DIAGNOSIS — I5021 Acute systolic (congestive) heart failure: Secondary | ICD-10-CM | POA: Diagnosis not present

## 2017-12-09 DIAGNOSIS — I429 Cardiomyopathy, unspecified: Secondary | ICD-10-CM | POA: Diagnosis not present

## 2017-12-21 DIAGNOSIS — I5022 Chronic systolic (congestive) heart failure: Secondary | ICD-10-CM | POA: Diagnosis not present

## 2017-12-21 DIAGNOSIS — I6523 Occlusion and stenosis of bilateral carotid arteries: Secondary | ICD-10-CM | POA: Diagnosis not present

## 2017-12-21 DIAGNOSIS — I779 Disorder of arteries and arterioles, unspecified: Secondary | ICD-10-CM | POA: Diagnosis not present

## 2017-12-21 DIAGNOSIS — E782 Mixed hyperlipidemia: Secondary | ICD-10-CM | POA: Diagnosis not present

## 2017-12-21 DIAGNOSIS — R0602 Shortness of breath: Secondary | ICD-10-CM | POA: Diagnosis not present

## 2017-12-21 DIAGNOSIS — I2510 Atherosclerotic heart disease of native coronary artery without angina pectoris: Secondary | ICD-10-CM | POA: Diagnosis not present

## 2017-12-21 DIAGNOSIS — N183 Chronic kidney disease, stage 3 (moderate): Secondary | ICD-10-CM | POA: Diagnosis not present

## 2017-12-21 DIAGNOSIS — I481 Persistent atrial fibrillation: Secondary | ICD-10-CM | POA: Diagnosis not present

## 2017-12-21 DIAGNOSIS — I10 Essential (primary) hypertension: Secondary | ICD-10-CM | POA: Diagnosis not present

## 2017-12-23 ENCOUNTER — Ambulatory Visit: Payer: PPO | Attending: Internal Medicine

## 2017-12-23 DIAGNOSIS — R262 Difficulty in walking, not elsewhere classified: Secondary | ICD-10-CM | POA: Insufficient documentation

## 2017-12-23 DIAGNOSIS — M6281 Muscle weakness (generalized): Secondary | ICD-10-CM | POA: Insufficient documentation

## 2017-12-23 DIAGNOSIS — R2681 Unsteadiness on feet: Secondary | ICD-10-CM | POA: Diagnosis not present

## 2017-12-23 NOTE — Therapy (Signed)
[facility] [address] [place], [address] Phone: [phone]   Fax:  [phone]  Physical Therapy Evaluation  Patient Details  Name: Samuel Perry MRN: [id number] Date of Birth: 04-25-1927 Referring Provider: Fulton Reek, MD   Encounter Date: 12/23/2017  PT End of Session - 12/23/17 1351    Visit Number  1    Number of Visits  13    Date for PT Re-Evaluation  02/04/18    PT Start Time  9417    PT Stop Time  1450    PT Time Calculation (min)  58 min    Activity Tolerance  Patient tolerated treatment well;Patient limited by fatigue    Behavior During Therapy  [facility] for tasks assessed/performed       Past Medical History:  Diagnosis Date  . A-fib ([place])   . Aortic disease ([place])    per pt and wife  . Bilateral carotid artery stenosis    per pt and wife  . CHF (congestive heart failure) ([place])   . CKD (chronic kidney disease) stage 3, GFR 30-59 ml/min (HCC)    per pt and wife  . CKD (chronic kidney disease), stage II   . Coronary artery disease   . Elevated PSA    per pt and wife  . Hypertension   . Mixed hyperlipidemia    per pt and wife  . SOBOE (shortness of breath on exertion)    per pt and wife    Past Surgical History:  Procedure Laterality Date  . JOINT REPLACEMENT      There were no vitals filed for this visit.   Subjective Assessment - 12/23/17 1358    Subjective  Denies chest pain    Patient is accompained by:  Family member wife    Pertinent History  Generalized weakness. Pt played golf 3 days a week. One day he did not feel good. Went to the hospital, could not breathe. Had fluid build up.  Was admitted to the hospital from the emergency room.  Had home health PT and was feeling better then (2 weeks ago) compared to now. Did light exercises with home health but has not been able to do his HEP due to low energy. Went to the hospital initally on October 14, 2017.  Then returned to  the hospital Novermber 5, 2018.   Had 3 weeks of home health PT 2x/week after November.  Has been doing some easy exercises such as light squats, heel raises and step ups.  Nothing hurts. Feels weaker after stopping home health.  Pt wants to get back onto the golf course.   Sees a cardiologist.  Currently feeling dizzy but gets more pronounced when he gets up on his feet. Able to get up at his house and not have fear of falling due to being able to use furniture. Holds onto his wife's arm for safety when walking.  Pt states that his heart is operating at 10-15 % with afib.  Currently has shortness of breath but does not think that it is getting worse.  Pt states that his cardiologist knows pt is participating in PT.     Patient Stated Goals  Pt expresses desire to improve strength, be more active.    Currently in Pain?  No/denies         [facility] PT Assessment - 12/23/17 0001      Assessment   Medical Diagnosis  Generalized weakness  Referring Provider  Fulton Reek, MD    Onset Date/Surgical Date  12/09/17 Date of PT referral    Prior Therapy  Pt particpated in home health PT and did well. Unable to continue with his HEP from it due to reports of low energy.      Precautions   Precaution Comments  fall risk, complicated medical history      Balance Screen   Has the patient fallen in the past 6 months  No    Has the patient had a decrease in activity level because of a fear of falling?   No pt states fear of falling; weakness    Is the patient reluctant to leave their home because of a fear of falling?   Yes      Home Environment   Additional Comments  Pt lives in a 2 story home with wife. 3 steps to enter, L rail. 1 set of steps inside, L rail.       Prior Function   Vocation  Retired retired 1999    Vocation Requirements  PLOF: able to play golf 3 days a week, less difficulty walking.      Observation/Other Assessments   Observations  Sit <> stand, mod I. Dizziness when standing      Lower Extremity Functional Scale   31/80      Strength   Right Hip ABduction  4/5 seated clamshell isometric, gentle resistance    Left Hip ABduction  4/5 seated clamshell isometric, gentle resistance    Right Knee Flexion  3/5 at least, based on sit <> stand and walking    Right Knee Extension  3/5 at least, based on sit <> stand and walking    Left Knee Flexion  3/5 at least, based on sit <> stand and walking    Left Knee Extension  3/5 at least, based on sit <> stand and walking      Transfers   Comments  Mod I but with difficulty observed.       Ambulation/Gait   Gait Comments  Min A, No AD, unsteady, forward flexed.              Objective measurements completed on examination: See above findings.   Vitals SpO2 96%, HR 95 BPM Blood pressure L arm sitting, mechanically taken: 109/73, HR 102.    Denies chest pains. Pt was very active PLOF: played golf. DNR per pt and wife. Did not perform HEP from home health PT initially secondary to low energy.   Pt expresses desire to perform PT    Ther-ex   Seated bilateral ankle DF/PF 10x   94% SpO2, 100 BPM   Seated knee extension 5x each LE   Eval paused secondary to being able to get in touch with Dr. Doy Hutching.   Informed Dr Doy Hutching of pt subjective report of dizziness at rest, previously being able to participate in home health PT and doing well but could not perform his HEP after stopping home health PT due to reports of low energy.   Dr. Doy Hutching said to hold off PT and for pt to make an appointment with Dr. Doy Hutching as soon as possible. Per nurse Olivia Mackie), cardiologist note states more shortness of breath.     Dr. Doy Hutching' instruction conveyed to pt and wife.   Session stopped based on MD instruction.   Walked with pt and wife to their vehicle (21 ft) with hand held assist with wife. LOB x 1 backwards and to the  L. PT min A to recover.  CGA with car transfer.  Pt has a rw at home based on medical intake form.    Patient is an 84 year old male who came to PT evaluation secondary to generalized weakness. He currently presents with decreased activity tolerance, weakness, decreased balance, unsteadiness with gait, and difficulty performing functional tasks such as walking, and performing chores at home. PT currently on hold secondary to MD instruction and for pt to see MD. Patient will benefit from skilled PT services to address the aforementioned deficits once cleared by MD.       PT Education - 12/23/17 1603    Education provided  Yes    Education Details  MD instruction    Person(s) Educated  Patient;Spouse    Methods  Explanation    Comprehension  Verbalized understanding          PT Long Term Goals - 12/23/17 1616      PT LONG TERM GOAL #1   Title  Pt will be able to ambulate independently 80 ft without LOB to promote mobility.     Baseline  70 ft with hand held assist from wife and pt holding onto wall and furniture at times, LOB x 1 with min A from PT to recover (12/23/2017).    Time  6    Period  Weeks    Status  New    Target Date  02/04/18      PT LONG TERM GOAL #2   Title  Patient will improve his LEFS score by at least 9 points as a demonstration of improved function.     Baseline  31/80 (12/23/2016)    Time  6    Period  Weeks    Status  New    Target Date  02/04/18             Plan - 12/23/17 1604    Clinical Impression Statement  Patient is an 84 year old male who came to PT evaluation secondary to generalized weakness. He currently presents with decreased activity tolerance, weakness, decreased balance, unsteadiness with gait, and difficulty performing functional tasks such as walking, and performing chores at home. PT currently on hold secondary to MD instruction and for pt to see MD. Patient will benefit from skilled PT services to address the aforementioned deficits once cleared by MD.    History and Personal Factors relevant to plan of care:  CHF, low ejection  fraction (based on notes), medical history, weakness, decreased activity tolerance, difficulty walking, difficulty performing daily tasks    Clinical Presentation  Evolving    Clinical Presentation due to:  Did well with home health PT but unable to continue with his HEP due 2 low energy levels (2 weeks ago). Was feeling better 2 weeks ago compared to today based on pt subjective reports.     Clinical Decision Making  Moderate    Rehab Potential  Fair    Clinical Impairments Affecting Rehab Potential  (-) age, medical history; (+) motivated, good wife support    PT Frequency  2x / week    PT Duration  6 weeks When cleared by MD    PT Treatment/Interventions  Manual techniques;Patient/family education;Therapeutic exercise;Therapeutic activities;Balance training;Functional mobility training;Neuromuscular re-education;Gait training;Electrical Stimulation    PT Next Visit Plan  Gentle functional strengthening if appropriate when cleared by MD    Consulted and Agree with Plan of Care  Patient       Patient will benefit from  skilled therapeutic intervention in order to improve the following deficits and impairments:  Difficulty walking, Decreased strength, Decreased safety awareness, Decreased mobility, Decreased endurance, Decreased balance, Decreased activity tolerance  Visit Diagnosis: Muscle weakness (generalized) - Plan: PT plan of care cert/re-cert  Difficulty in walking, not elsewhere classified - Plan: PT plan of care cert/re-cert  Unsteadiness on feet - Plan: PT plan of care cert/re-cert     Problem List Patient Active Problem List   Diagnosis Date Noted  . Chronic systolic heart failure ([place]) 10/22/2017  . HTN (hypertension) 10/22/2017  . CKD (chronic kidney disease), stage III ([place]) 10/22/2017  . Lymphedema 10/22/2017  . Atrial fibrillation with RVR ([place]) 10/14/2017  . Cardiomyopathy ([place]) 10/14/2017   Thank you for your referral.  Joneen Boers PT, DPT   12/23/2017, 4:51  PM  [facility] [address] [place], [address] Phone: [phone]   Fax:  [phone]  Name: JUQUAN REZNICK MRN: [id number] Date of Birth: January 17, 1927

## 2017-12-24 ENCOUNTER — Telehealth: Payer: Self-pay

## 2017-12-24 DIAGNOSIS — I480 Paroxysmal atrial fibrillation: Secondary | ICD-10-CM | POA: Diagnosis not present

## 2017-12-24 DIAGNOSIS — R531 Weakness: Secondary | ICD-10-CM | POA: Diagnosis not present

## 2017-12-24 DIAGNOSIS — I5022 Chronic systolic (congestive) heart failure: Secondary | ICD-10-CM | POA: Diagnosis not present

## 2017-12-24 NOTE — Telephone Encounter (Signed)
Talked with nurse from Dr. Doy Hutching' office and ok to continue with PT.

## 2017-12-28 ENCOUNTER — Ambulatory Visit: Payer: PPO

## 2017-12-28 ENCOUNTER — Other Ambulatory Visit: Payer: Self-pay

## 2017-12-28 ENCOUNTER — Encounter: Payer: Self-pay | Admitting: Physical Therapy

## 2017-12-28 ENCOUNTER — Ambulatory Visit: Payer: PPO | Admitting: Physical Therapy

## 2017-12-28 DIAGNOSIS — M6281 Muscle weakness (generalized): Secondary | ICD-10-CM | POA: Diagnosis not present

## 2017-12-28 DIAGNOSIS — R2681 Unsteadiness on feet: Secondary | ICD-10-CM

## 2017-12-28 DIAGNOSIS — R262 Difficulty in walking, not elsewhere classified: Secondary | ICD-10-CM

## 2017-12-28 NOTE — Therapy (Signed)
[facility] [address], [place], [address] Phone: [phone]   Fax:  [phone]  Physical Therapy Treatment  Patient Details  Name: Samuel Perry MRN: [id number] Date of Birth: August 11, 1927 Referring Provider: Fulton Reek, MD   Encounter Date: 12/28/2017  PT End of Session - 12/28/17 1021    Visit Number  2    Number of Visits  13    Date for PT Re-Evaluation  02/04/18    PT Start Time  1021    PT Stop Time  1104    PT Time Calculation (min)  43 min    Activity Tolerance  Patient tolerated treatment well;Patient limited by fatigue    Behavior During Therapy  [facility] for tasks assessed/performed       Past Medical History:  Diagnosis Date  . A-fib ([place])   . Aortic disease ([place])    per pt and wife  . Bilateral carotid artery stenosis    per pt and wife  . CHF (congestive heart failure) ([place])   . CKD (chronic kidney disease) stage 3, GFR 30-59 ml/min (HCC)    per pt and wife  . CKD (chronic kidney disease), stage II   . Coronary artery disease   . Elevated PSA    per pt and wife  . Hypertension   . Mixed hyperlipidemia    per pt and wife  . SOBOE (shortness of breath on exertion)    per pt and wife    Past Surgical History:  Procedure Laterality Date  . JOINT REPLACEMENT      There were no vitals filed for this visit.  Subjective Assessment - 12/28/17 1021    Subjective  Per previous treating PT, the pt has been cleared by his MD to continue working with PT.  Pt would like to work on strengthening and balance to allow him to move around easier.      Patient is accompained by:  Family member wife    Pertinent History  Generalized weakness. Pt played golf 3 days a week. One day he did not feel good. Went to the hospital, could not breathe. Had fluid build up.  Was admitted to the hospital from the emergency room.  Had home health PT and was feeling better then (2 weeks ago) compared to now. Did  light exercises with home health but has not been able to do his HEP due to low energy. Went to the hospital initally on October 14, 2017.  Then returned to the hospital Novermber 5, 2018.   Had 3 weeks of home health PT 2x/week after November.  Has been doing some easy exercises such as light squats, heel raises and step ups.  Nothing hurts. Feels weaker after stopping home health.  Pt wants to get back onto the golf course.   Sees a cardiologist.  Currently feeling dizzy but gets more pronounced when he gets up on his feet. Able to get up at his house and not have fear of falling due to being able to use furniture. Holds onto his wife's arm for safety when walking.  Pt states that his heart is operating at 10-15 % with afib.  Currently has shortness of breath but does not think that it is getting worse.  Pt states that his cardiologist knows pt is participating in PT.     Patient Stated Goals  Pt expresses desire to improve strength, be more active.    Currently in Pain?  No/denies        Vitals at start of session before activity: SpO2 98%, Pulse 93, BP 106/69.   TREATMENT  Therapeutic Exercise:  Seated marching with 3# weight x10 each LE, x20 each LE. Pt demonstrates fatigue with this.  Pt reports SOB with activity. Seated pursed lip breathing with cues and demonstration for proper technique. Pt practiced x1 minute in sitting position. Was encouraged to continue this technique throughout remainder of session and incorporate into HEP.  Seated Bil knee extension 2x15 each LE  Sit<>stand 2x5 with BUE support. Pt unable to perform without UE support. Min assist x1 due to posterior LOB x1. Cues to achieve full upright when coming to stand.   Neuromuscular Re-ed:  Balancing with eyes closed x45 seconds. Close min guard as pt with increased postural sway but no LOB  Tandem stance x45 seconds each LE  SLS x30 seconds with intermittent min A to stabilize  Stepping in 4 squares with verbal cues of  where to step next x2 minutes.  SpO2 remains at or above 90% on RA throughout session, Pulse 78-133. Pt given seated rest breaks between each exercise as pt demonstrates fatigue at end of each exercise.   Vitals at end of session: BP 109/75, SpO2 99%, pulse 123       PT Education - 12/28/17 1021    Education provided  Yes    Education Details  Exercise technique; encouraged pt to get up and walk around the house every 45 minutes or so    Person(s) Educated  Patient    Methods  Explanation;Demonstration;Verbal cues    Comprehension  Verbalized understanding;Returned demonstration;Verbal cues required;Need further instruction          PT Long Term Goals - 12/23/17 1616      PT LONG TERM GOAL #1   Title  Pt will be able to ambulate independently 80 ft without LOB to promote mobility.     Baseline  70 ft with hand held assist from wife and pt holding onto wall and furniture at times, LOB x 1 with min A from PT to recover (12/23/2017).    Time  6    Period  Weeks    Status  New    Target Date  02/04/18      PT LONG TERM GOAL #2   Title  Patient will improve his LEFS score by at least 9 points as a demonstration of improved function.     Baseline  31/80 (12/23/2016)    Time  6    Period  Weeks    Status  New    Target Date  02/04/18            Plan - 12/28/17 1039    Clinical Impression Statement  Pt demonstrates fatigue and SOB with each exercise and was provided a seated rest break between each exercise.  VSS throughout session with vitals monitored throughout session.  Pt tolerated all interventions well.  He demonstrates signficant BLE weakness and poor endurance.  Instructed pt in pursed lip breathing technique and encouraged pt to perform this throughout session as well as part of HEP. Will continue to gradually progress strengthening and balance exercises for improved tolerance of functional activity throughout patient's day.     Rehab Potential  Fair    Clinical  Impairments Affecting Rehab Potential  (-) age, medical history; (+) motivated, good wife support    PT Frequency  2x / week    PT Duration  6  weeks When cleared by MD    PT Treatment/Interventions  Manual techniques;Patient/family education;Therapeutic exercise;Therapeutic activities;Balance training;Functional mobility training;Neuromuscular re-education;Gait training;Electrical Stimulation    PT Next Visit Plan  Gentle functional strengthening if appropriate when cleared by MD    Consulted and Agree with Plan of Care  Patient       Patient will benefit from skilled therapeutic intervention in order to improve the following deficits and impairments:  Difficulty walking, Decreased strength, Decreased safety awareness, Decreased mobility, Decreased endurance, Decreased balance, Decreased activity tolerance  Visit Diagnosis: Muscle weakness (generalized)  Difficulty in walking, not elsewhere classified  Unsteadiness on feet     Problem List Patient Active Problem List   Diagnosis Date Noted  . Chronic systolic heart failure ([place]) 10/22/2017  . HTN (hypertension) 10/22/2017  . CKD (chronic kidney disease), stage III ([place]) 10/22/2017  . Lymphedema 10/22/2017  . Atrial fibrillation with RVR ([place]) 10/14/2017  . Cardiomyopathy ([place]) 10/14/2017    Collie Siad PT, DPT 12/28/2017, 11:07 AM  [facility] [address] [place], [address] Phone: [phone]   Fax:  [phone]  Name: Samuel Perry MRN: [id number] Date of Birth: 1927/02/15

## 2017-12-29 DIAGNOSIS — R7309 Other abnormal glucose: Secondary | ICD-10-CM | POA: Diagnosis not present

## 2017-12-29 DIAGNOSIS — Z79899 Other long term (current) drug therapy: Secondary | ICD-10-CM | POA: Diagnosis not present

## 2017-12-29 DIAGNOSIS — I10 Essential (primary) hypertension: Secondary | ICD-10-CM | POA: Diagnosis not present

## 2017-12-29 DIAGNOSIS — E782 Mixed hyperlipidemia: Secondary | ICD-10-CM | POA: Diagnosis not present

## 2017-12-30 ENCOUNTER — Other Ambulatory Visit: Payer: Self-pay

## 2017-12-30 ENCOUNTER — Ambulatory Visit: Payer: PPO | Admitting: Physical Therapy

## 2017-12-30 ENCOUNTER — Ambulatory Visit: Payer: PPO

## 2017-12-30 ENCOUNTER — Encounter: Payer: Self-pay | Admitting: Physical Therapy

## 2017-12-30 DIAGNOSIS — M6281 Muscle weakness (generalized): Secondary | ICD-10-CM | POA: Diagnosis not present

## 2017-12-30 DIAGNOSIS — R262 Difficulty in walking, not elsewhere classified: Secondary | ICD-10-CM

## 2017-12-30 DIAGNOSIS — R2681 Unsteadiness on feet: Secondary | ICD-10-CM

## 2017-12-30 NOTE — Therapy (Signed)
[facility] [address], [place], [address] Phone: [phone]   Fax:  [phone]  Physical Therapy Treatment  Patient Details  Name: Samuel Perry MRN: [id number] Date of Birth: December 11, 1927 Referring Provider: Fulton Reek, MD   Encounter Date: 12/30/2017  PT End of Session - 12/30/17 1427    Visit Number  3    Number of Visits  13    Date for PT Re-Evaluation  02/04/18    PT Start Time  7741    PT Stop Time  1502    PT Time Calculation (min)  34 min    Activity Tolerance  Patient tolerated treatment well;Patient limited by fatigue    Behavior During Therapy  [facility] for tasks assessed/performed       Past Medical History:  Diagnosis Date  . A-fib ([place])   . Aortic disease ([place])    per pt and wife  . Bilateral carotid artery stenosis    per pt and wife  . CHF (congestive heart failure) ([place])   . CKD (chronic kidney disease) stage 3, GFR 30-59 ml/min (HCC)    per pt and wife  . CKD (chronic kidney disease), stage II   . Coronary artery disease   . Elevated PSA    per pt and wife  . Hypertension   . Mixed hyperlipidemia    per pt and wife  . SOBOE (shortness of breath on exertion)    per pt and wife    Past Surgical History:  Procedure Laterality Date  . JOINT REPLACEMENT      There were no vitals filed for this visit.  Subjective Assessment - 12/30/17 1430    Subjective  Pt reports he is not feeling well today.  He did not sleep last night which he believes is due to fluid buildup.  MD already aware that he has on/off fluid buildup and pt takes furosemide as prescribed.  Wife called the MD today to let him know how the pt was feeling and the MD prescribed the pt an additional diuretic which the pt will take when he gets home and pt is scheduled to be seen by his cardiologist on Friday. Pt has been practicing his pursed lip breathing at home.  Says he felt good after last session and yesterday.      Patient is accompained by:  Family member wife    Pertinent History  Generalized weakness. Pt played golf 3 days a week. One day he did not feel good. Went to the hospital, could not breathe. Had fluid build up.  Was admitted to the hospital from the emergency room.  Had home health PT and was feeling better then (2 weeks ago) compared to now. Did light exercises with home health but has not been able to do his HEP due to low energy. Went to the hospital initally on October 14, 2017.  Then returned to the hospital Novermber 5, 2018.   Had 3 weeks of home health PT 2x/week after November.  Has been doing some easy exercises such as light squats, heel raises and step ups.  Nothing hurts. Feels weaker after stopping home health.  Pt wants to get back onto the golf course.   Sees a cardiologist.  Currently feeling dizzy but gets more pronounced when he gets up on his feet. Able to get up at his house and not have fear of falling due to being able to use furniture. Holds onto his  wife's arm for safety when walking.  Pt states that his heart is operating at 10-15 % with afib.  Currently has shortness of breath but does not think that it is getting worse.  Pt states that his cardiologist knows pt is participating in PT.     Patient Stated Goals  Pt expresses desire to improve strength, be more active.    Currently in Pain?  No/denies       Vitals at start of session before activity: SpO2 99%, Pulse 55, BP 98/58.    TREATMENT   Therapeutic Exercise:   Seated marching with 3# weight 2x20 each LE.  Pursed lip breathing in sitting between exercises.  Seated Bil knee extension 2x15 each LE with 3# ankle weights  Sit<>stand 2x5 with BUE support. Pt unable to perform without UE support. Cues to achieve full upright when coming to stand.  Toe taps up to 4" step with intermittent 1UE supported x10 each LE. Pt felt dizzy at the end of this exercise so pt returned to sitting and vitals below were taken.  SpO2  remains at or above 96% on RA throughout session, pulse 51-58. Pt given seated rest breaks between each exercise as pt demonstrates fatigue at end of each exercise.  Vitals at end of session: BP 95/57, SpO2 100, pulse 57                        PT Education - 12/30/17 1427    Education provided  Yes    Education Details  Exercise technique    Person(s) Educated  Patient    Methods  Explanation;Demonstration;Verbal cues    Comprehension  Verbalized understanding;Returned demonstration;Verbal cues required;Need further instruction          PT Long Term Goals - 12/23/17 1616      PT LONG TERM GOAL #1   Title  Pt will be able to ambulate independently 80 ft without LOB to promote mobility.     Baseline  70 ft with hand held assist from wife and pt holding onto wall and furniture at times, LOB x 1 with min A from PT to recover (12/23/2017).    Time  6    Period  Weeks    Status  New    Target Date  02/04/18      PT LONG TERM GOAL #2   Title  Patient will improve his LEFS score by at least 9 points as a demonstration of improved function.     Baseline  31/80 (12/23/2016)    Time  6    Period  Weeks    Status  New    Target Date  02/04/18            Plan - 12/30/17 1443    Clinical Impression Statement  Pt not feeling well today as he believes he has some fluid buildup.  MD prescribed medication to address this and pt has an appointment with cardiologist on Friday.  Pt given rest breaks between each exercise.  Vitals remained stable throughout session although BP slightly lower than pt's baseline and pulse lower than pt's baseline which he says has been this way all day. Encouraged pt to monitor vitals at home throughout the day and to call 911 if he begins to feel as though something is not right, pt verbalized understanding.  Pt will benefit from continued skilled PT interventions for improved strength and QOL.     Rehab Potential  Fair  Clinical Impairments  Affecting Rehab Potential  (-) age, medical history; (+) motivated, good wife support    PT Frequency  2x / week    PT Duration  6 weeks When cleared by MD    PT Treatment/Interventions  Manual techniques;Patient/family education;Therapeutic exercise;Therapeutic activities;Balance training;Functional mobility training;Neuromuscular re-education;Gait training;Electrical Stimulation    PT Next Visit Plan  Gentle functional strengthening if appropriate when cleared by MD    PT Home Exercise Plan  marching and LAQ in sitting, mini squats    Recommended Other Services  none at this time    Consulted and Agree with Plan of Care  Patient       Patient will benefit from skilled therapeutic intervention in order to improve the following deficits and impairments:  Difficulty walking, Decreased strength, Decreased safety awareness, Decreased mobility, Decreased endurance, Decreased balance, Decreased activity tolerance  Visit Diagnosis: Muscle weakness (generalized)  Difficulty in walking, not elsewhere classified  Unsteadiness on feet     Problem List Patient Active Problem List   Diagnosis Date Noted  . Chronic systolic heart failure ([place]) 10/22/2017  . HTN (hypertension) 10/22/2017  . CKD (chronic kidney disease), stage III ([place]) 10/22/2017  . Lymphedema 10/22/2017  . Atrial fibrillation with RVR ([place]) 10/14/2017  . Cardiomyopathy ([place]) 10/14/2017    Collie Siad PT, DPT 12/30/2017, 3:06 PM  [facility] [address], [place], [address] Phone: [phone]   Fax:  [phone]  Name: JUWANN SHERK MRN: [id number] Date of Birth: Jun 08, 1927

## 2018-01-01 ENCOUNTER — Emergency Department: Payer: PPO

## 2018-01-01 ENCOUNTER — Other Ambulatory Visit: Payer: Self-pay | Admitting: *Deleted

## 2018-01-01 ENCOUNTER — Encounter: Payer: Self-pay | Admitting: Emergency Medicine

## 2018-01-01 ENCOUNTER — Emergency Department
Admission: EM | Admit: 2018-01-01 | Discharge: 2018-01-22 | Disposition: E | Payer: PPO | Attending: Student in an Organized Health Care Education/Training Program | Admitting: Student in an Organized Health Care Education/Training Program

## 2018-01-01 DIAGNOSIS — R918 Other nonspecific abnormal finding of lung field: Secondary | ICD-10-CM | POA: Diagnosis not present

## 2018-01-01 DIAGNOSIS — Z79899 Other long term (current) drug therapy: Secondary | ICD-10-CM | POA: Diagnosis not present

## 2018-01-01 DIAGNOSIS — I469 Cardiac arrest, cause unspecified: Secondary | ICD-10-CM

## 2018-01-01 DIAGNOSIS — Z8679 Personal history of other diseases of the circulatory system: Secondary | ICD-10-CM | POA: Diagnosis not present

## 2018-01-01 DIAGNOSIS — R001 Bradycardia, unspecified: Secondary | ICD-10-CM | POA: Diagnosis not present

## 2018-01-01 DIAGNOSIS — I129 Hypertensive chronic kidney disease with stage 1 through stage 4 chronic kidney disease, or unspecified chronic kidney disease: Secondary | ICD-10-CM | POA: Diagnosis not present

## 2018-01-01 DIAGNOSIS — Z7901 Long term (current) use of anticoagulants: Secondary | ICD-10-CM | POA: Diagnosis not present

## 2018-01-01 DIAGNOSIS — N183 Chronic kidney disease, stage 3 (moderate): Secondary | ICD-10-CM | POA: Insufficient documentation

## 2018-01-01 DIAGNOSIS — J9602 Acute respiratory failure with hypercapnia: Secondary | ICD-10-CM | POA: Diagnosis not present

## 2018-01-01 DIAGNOSIS — I5022 Chronic systolic (congestive) heart failure: Secondary | ICD-10-CM | POA: Diagnosis not present

## 2018-01-01 DIAGNOSIS — I480 Paroxysmal atrial fibrillation: Secondary | ICD-10-CM | POA: Diagnosis not present

## 2018-01-01 DIAGNOSIS — J9601 Acute respiratory failure with hypoxia: Secondary | ICD-10-CM

## 2018-01-01 MED ORDER — HALOPERIDOL 0.5 MG PO TABS: 0.5000 mg | ORAL_TABLET | ORAL | Status: DC | PRN

## 2018-01-01 MED ORDER — MIDAZOLAM HCL 50 MG/10ML IJ SOLN
2.0000 mg/h | INTRAMUSCULAR | Status: DC
  Filled 2018-01-01: qty 10

## 2018-01-01 MED ORDER — ONDANSETRON HCL 4 MG/2ML IJ SOLN: 4.0000 mg | Freq: Four times a day (QID) | INTRAMUSCULAR | Status: DC | PRN

## 2018-01-01 MED ORDER — ETOMIDATE 2 MG/ML IV SOLN
INTRAVENOUS | Status: AC | PRN
  Administered 2018-01-01: 20 mg via INTRAVENOUS

## 2018-01-01 MED ORDER — GLYCOPYRROLATE 0.2 MG/ML IJ SOLN: 0.2000 mg | INTRAMUSCULAR | Status: DC | PRN

## 2018-01-01 MED ORDER — FENTANYL 2500MCG IN NS 250ML (10MCG/ML) PREMIX INFUSION
0.0000 ug/h | INTRAVENOUS | Status: DC
  Filled 2018-01-01: qty 250

## 2018-01-01 MED ORDER — BIOTENE DRY MOUTH MT LIQD: 15.0000 mL | OROMUCOSAL | Status: DC | PRN

## 2018-01-01 MED ORDER — HALOPERIDOL LACTATE 2 MG/ML PO CONC: 0.5000 mg | ORAL | Status: DC | PRN

## 2018-01-01 MED ORDER — HALOPERIDOL LACTATE 5 MG/ML IJ SOLN: 0.5000 mg | INTRAMUSCULAR | Status: DC | PRN

## 2018-01-01 MED ORDER — MORPHINE SULFATE (PF) 2 MG/ML IV SOLN
2.0000 mg | INTRAVENOUS | Status: DC | PRN
  Administered 2018-01-01: 2 mg via INTRAVENOUS
  Filled 2018-01-01: qty 1

## 2018-01-01 MED ORDER — ONDANSETRON 4 MG PO TBDP: 4.0000 mg | ORAL_TABLET | Freq: Four times a day (QID) | ORAL | Status: DC | PRN

## 2018-01-01 MED ORDER — ROCURONIUM BROMIDE 50 MG/5ML IV SOLN
INTRAVENOUS | Status: AC | PRN
  Administered 2018-01-01: 100 mg via INTRAVENOUS

## 2018-01-01 MED ORDER — GLYCOPYRROLATE 1 MG PO TABS: 1.0000 mg | ORAL_TABLET | ORAL | Status: DC | PRN

## 2018-01-01 MED ORDER — SODIUM CHLORIDE 0.9 % IV SOLN
0.0000 ug/h | INTRAVENOUS | Status: DC
Start: 2018-01-01 — End: 2018-01-01
  Filled 2018-01-01: qty 50

## 2018-01-01 MED ORDER — ACETAMINOPHEN 650 MG RE SUPP: 650.0000 mg | Freq: Four times a day (QID) | RECTAL | Status: DC | PRN

## 2018-01-01 MED ORDER — ACETAMINOPHEN 325 MG PO TABS: 650.0000 mg | ORAL_TABLET | Freq: Four times a day (QID) | ORAL | Status: DC | PRN

## 2018-01-01 MED ORDER — POLYVINYL ALCOHOL 1.4 % OP SOLN: 1.0000 [drp] | Freq: Four times a day (QID) | OPHTHALMIC | Status: DC | PRN

## 2018-01-01 NOTE — ED Notes (Signed)
Patient extubated at this time.  Respiratory and MD present at bedside with this RN.

## 2018-01-01 NOTE — Code Documentation (Signed)
Family updated as to patient's status.

## 2018-01-01 NOTE — Patient Outreach (Signed)
Unsuccessful telephone encounter to Damel Querry, 84 year old male- follow up on current clinical status as this RN CM followed pt for transition of care/recent hospitalization November 5-8,2018 for acute HF, acute kidney injury. Transition of care program completed 12/02/17.   HIPAA compliant voice message left with contact name and number.   Plan: if no response, plan to follow up again next week telephonically.     Zara Chess.   [facility]  [phone]

## 2018-01-01 NOTE — ED Notes (Signed)
Patient's family has made the decision to extubate the patient due to his DNR status.  MD made aware, Chaplain with the family at this time.

## 2018-01-01 NOTE — Code Documentation (Signed)
Radiology at bedside

## 2018-01-01 NOTE — Progress Notes (Signed)
[name] responded to a PG for incoming CPR. Pt was being evaluated on my arrival. Pt is intubated and unable to respond. [place] escorted Wife, daughter, and son-in-law to family waiting room. CH secured warm blankets for the wife and daughter. EDP updated the family of the Pt status which was not optimistic. Family has decided to extubate, feeling this would be the desire of the Pt. Family is spiritually strong. [place] provided the ministry of hospitality, empathetic listening, pastoral presence, and prayer. [place] will monitor and is available for follow up if needed.    01/16/2018 2300  Clinical Encounter Type  Visited With Patient and family together;Health care provider  Visit Type Initial;Spiritual support;Code;ED;Patient actively dying (CPR)  Referral From Nurse  Spiritual Encounters  Spiritual Needs Prayer;Emotional;Grief support

## 2018-01-01 NOTE — ED Triage Notes (Signed)
Pt comes into the ED via [facility] from home where he had a witnessed arrest and was given 6-8 minutes CPR with 2 epi's administered.  Patient presents in NSR with BP of 180/100 and trying to breath over his [facility] airways.

## 2018-01-01 NOTE — ED Provider Notes (Signed)
[facility] Emergency Department Provider Note    First MD Initiated Contact with Patient 01/15/2018 2146     (approximate)  I have reviewed the triage vital signs and the nursing notes.   HISTORY  Chief Complaint Post arrest Level V Caveat:  Cardiac arrest  HPI MC BLOODWORTH is a 84 y.o. male resents after witnessed arrest with roughly 5 minutes downtime.  Patient was apparently with his wife watching TV when he went unresponsive.  She called EMS.  Found the patient to be pulseless.  They performed roughly 6 minutes of CPR with 2 rounds of epi and 1 defibrillation with AED.  They did place a supraglottic airway and bring the patient to the ER.  He arrives with pulses.  Pupils are midpoint.  Does have intact gag.  No evidence of trauma.  Past Medical History:  Diagnosis Date  . A-fib ([place])   . Aortic disease ([place])    per pt and wife  . Bilateral carotid artery stenosis    per pt and wife  . CHF (congestive heart failure) ([place])   . CKD (chronic kidney disease) stage 3, GFR 30-59 ml/min (HCC)    per pt and wife  . CKD (chronic kidney disease), stage II   . Coronary artery disease   . Elevated PSA    per pt and wife  . Hypertension   . Mixed hyperlipidemia    per pt and wife  . SOBOE (shortness of breath on exertion)    per pt and wife   Family History  Problem Relation Age of Onset  . CAD Mother   . CAD Brother    Past Surgical History:  Procedure Laterality Date  . JOINT REPLACEMENT     Patient Active Problem List   Diagnosis Date Noted  . Chronic systolic heart failure ([place]) 10/22/2017  . HTN (hypertension) 10/22/2017  . CKD (chronic kidney disease), stage III ([place]) 10/22/2017  . Lymphedema 10/22/2017  . Atrial fibrillation with RVR ([place]) 10/14/2017  . Cardiomyopathy ([place]) 10/14/2017      Prior to Admission medications   Medication Sig Start Date End Date Taking? Authorizing Provider  amiodarone (PACERONE) 100 MG tablet Take 200 mg  by mouth 2 (two) times daily.    [provider]  apixaban (ELIQUIS) 2.5 MG TABS tablet Take 1 tablet (2.5 mg total) by mouth 2 (two) times daily. 10/18/17   Loletha Grayer, MD  atorvastatin (LIPITOR) 80 MG tablet Take 80 mg by mouth daily.    [provider]  bisoprolol (ZEBETA) 5 MG tablet Take 10 mg 2 (two) times daily by mouth.    [provider]  cyanocobalamin 1000 MCG tablet Take 1,000 mcg by mouth daily.    [provider]  digoxin (LANOXIN) 0.125 MG tablet Take 1 tablet (0.125 mg total) daily by mouth. Patient not taking: Reported on 11/17/2017 10/29/17   Dustin Flock, MD  fluticasone [facility]) 50 MCG/ACT nasal spray Place 2 sprays into both nostrils daily.    [provider]  furosemide (LASIX) 40 MG tablet Take 1 tablet (40 mg total) 2 (two) times daily by mouth. 10/29/17   Dustin Flock, MD  gabapentin (NEURONTIN) 300 MG capsule Take 200 mg at bedtime by mouth.     [provider]  LATANOPROST OP Apply to eye. One drop both eyes at night.    [provider]  MAGNESIUM SULFATE PO Take 500 mg by mouth as needed.    [provider]  Multiple Vitamins-Minerals (MULTIVITAMIN WITH MINERALS) tablet Take 1 tablet by mouth daily.    [provider]  omeprazole (PRILOSEC) 40 MG capsule Take 40 mg by mouth daily.    [provider]  ondansetron (ZOFRAN) 4 MG tablet Take 1 tablet (4 mg total) every 8 (eight) hours as needed by mouth for nausea or vomiting. 10/29/17   Dustin Flock, MD  potassium chloride SA (K-DUR,KLOR-CON) 20 MEQ tablet Take 20 mEq by mouth 2 (two) times daily.    [provider]  tamsulosin (FLOMAX) 0.4 MG CAPS capsule Take 0.4 mg at bedtime by mouth. 30 minutes after same daily meal     [provider]  timolol (BETIMOL) 0.5 % ophthalmic solution Place 1 drop into both eyes daily.    [provider]  traMADol (ULTRAM) 50 MG tablet Take 50 mg by mouth every 6  (six) hours as needed.    [provider]    Allergies Patient has no known allergies.    Social History Social History   Tobacco Use  . Smoking status: Never Smoker  . Smokeless tobacco: Never Used  Substance Use Topics  . Alcohol use: Yes    Comment: Wine sometimes, none since being sick  . Drug use: No    Review of Systems Unable to assess - cardaic arrest ____________________________________________   PHYSICAL EXAM:  VITAL SIGNS: Vitals:   01/18/2018 2337 01/15/2018 2343  BP: (!) 84/42   Pulse: (!) 55 (!) 39  Resp: 18 12  SpO2: (!) 61% (!) 43%    Constitutional: Critically ill being actively bagged.  Does have pulses upon arrival but no purposeful movement.   Eyes: Conjunctivae are normal.  Pupils midpoint bilaterally Head: Atraumatic. Nose: No congestion/rhinnorhea. Mouth/Throat: Mucous membranes are dry.  [place] Air[place] in place Neck: No stridor. Cardiovascular: Normal rate, regular rhythm. Grossly normal heart sounds.  Good peripheral circulation. Respiratory: Breath sounds present bilaterally with bagging gastrointestinal: Soft. No distention. No abdominal bruits.  Genitourinary: normal external genitalia Musculoskeletal: No lower extremity tenderness nor edema.  No joint effusions.   Neurologic:  GCS 3t Skin:  Skin iscool, dry and intact. No rash noted.  ____________________________________________   LABS (all labs ordered are listed, but only abnormal results are displayed)  No results found for this or any previous visit (from the past 24 hour(s)). ____________________________________________  EKG My review and personal interpretation at Time: 21:31   Indication: cardiac arrest  Rate: 75  Rhythm: sinus Axis: normal Other: no stemi, nonspecific st changes ____________________________________________  RADIOLOGY  I personally reviewed all radiographic images ordered to evaluate for the above acute complaints and reviewed radiology reports and  findings.  These findings were personally discussed with the patient.  Please see medical record for radiology report.  ____________________________________________   PROCEDURES  Procedure(s) performed:  .Critical Care Performed by: Merlyn Lot, MD Authorized by: Merlyn Lot, MD   Critical care provider statement:    Critical care time (minutes):  40   Critical care time was exclusive of:  Separately billable procedures and treating other patients   Critical care was necessary to treat or prevent imminent or life-threatening deterioration of the following conditions:  Respiratory failure and cardiac failure   Critical care was time spent personally by me on the following activities:  Development of treatment plan with patient or surrogate, discussions with consultants, evaluation of patient's response to treatment, examination of patient, obtaining history from patient or surrogate, ordering and performing treatments and interventions, ordering and review of  laboratory studies, ordering and review of radiographic studies, pulse oximetry, re-evaluation of patient's condition and review of old charts Procedure Name: Intubation Date/Time: 01/14/2018 11:47 PM Performed by: Merlyn Lot, MD Pre-anesthesia Checklist: Patient identified, Emergency Drugs available, Suction available and Patient being monitored Preoxygenation: Pre-oxygenation with 100% oxygen Induction Type: IV induction and Rapid sequence Laryngoscope Size: Glidescope and 3 Grade View: Grade I Tube size: 7.5 mm Number of attempts: 1 Airway Equipment and Method: Video-laryngoscopy and Stylet Placement Confirmation: ETT inserted through vocal cords under direct vision,  CO2 detector and Breath sounds checked- equal and bilateral Dental Injury: Teeth and Oropharynx as per pre-operative assessment          Critical Care performed: yes ____________________________________________   INITIAL IMPRESSION /  ASSESSMENT AND PLAN / ED COURSE  Pertinent labs & imaging results that were available during my care of the patient were reviewed by me and considered in my medical decision making (see chart for details).  DDX: Dysrhythmia, ACS, PE, hemorrhage, sepsis, pericardial effusion, hyperglycemia  BUFORD BREMER is a 84 y.o. who presents to the ED status post cardiac arrest.  Supraglottic airway in place.  EKG shows no evidence of STEMI.  Patient without any purposeful movements at this time but did have gag reflex initially.  While waiting for family to arrive and to further stabilize patient ET tube was inserted as described above.  IV fluids initiated.  Clinical Course as of Jan 01 2355  Fri Jan 01, 2018  2202 Had extensive conversation with family at bedside regarding his goals of care and the wife states that he would never have wanted to be kept on life support.  She is just sided to proceed with palliative extubation.  I did advise her that I respect her wishes and will proceed with palliative extubation but would recommend waiting some time until the rocuronium wears off.  [PR]  03/22/15 Family remains at bedside and has requested that we delay palliative extubation until son can come to bedside.  He is reportedly less than half an hour away.  Patient will be signed out to Dr. Dahlia Client pending family arrival.  Patient does appear comfortable and in no distress.  He is not make any purposeful movements at this time.  [PR]  03-22-31 Patient was palliatively extubated with wife and son at bedside.  [PR]  2354-03-21 Time of death pronounced at 93.  Family present with Mr. Southwood at the time of his death.  [PR]    Clinical Course User Index [PR] Merlyn Lot, MD     ____________________________________________   FINAL CLINICAL IMPRESSION(S) / ED DIAGNOSES  Final diagnoses:  Cardiac arrest ([place])  Acute respiratory failure with hypoxia and hypercapnia ([place])      NEW MEDICATIONS STARTED DURING THIS  VISIT:  New Prescriptions   No medications on file     Note:  This document was prepared using Dragon voice recognition software and may include unintentional dictation errors.    Merlyn Lot, MD 01/09/2018 Mar 22, 2355

## 2018-01-02 MED FILL — Medication: Qty: 1 | Status: AC

## 2018-01-04 ENCOUNTER — Encounter: Payer: Self-pay | Admitting: *Deleted

## 2018-01-04 ENCOUNTER — Other Ambulatory Visit: Payer: Self-pay | Admitting: *Deleted

## 2018-01-04 ENCOUNTER — Ambulatory Visit: Payer: PPO

## 2018-01-04 NOTE — Patient Outreach (Signed)
View in EMR today, pt admitted to [facility] 12/23/2017  from home after witnessed cardiac arrest, expired 12/25/2017.   This RN CM to close case.   Plan:  Close case due to pt expired.            Send PCP case closure letter.            Inform [facility] CMA to close case.   Samuel Perry.   [facility]  [phone]

## 2018-01-06 ENCOUNTER — Ambulatory Visit: Payer: PPO

## 2018-01-22 NOTE — ED Notes (Signed)
Time of Death.  MD at bedside with this RN and patient's family.

## 2018-01-22 NOTE — ED Notes (Addendum)
Patient's wife has decided to continue with tissue donation with the organ donor services.  [employer] were called to be informed of this information.  Procedures were explained to the family in regards to how tissue donation would occur and that the patient could not be sent straight to the funeral home due to this.  Patient's family verbalized understanding and was informed that [place] would still contact them directly.

## 2018-01-22 NOTE — ED Notes (Signed)
This RN called [employer] and spoke with Leonor Liv in regards to this patient being an eminent death.  Case Number [phone].  Per Tim, the patient would only qualify for tissue donation.

## 2018-01-22 DEATH — deceased

## 2018-04-01 IMAGING — US US RENAL
1 series · 14 of 25 positions shown · non-contrast
Comparison: No recent.

CLINICAL DATA: Acute renal failure.  Chronic renal disease.

EXAM:
RENAL / URINARY TRACT ULTRASOUND COMPLETE

[Series 1: us renal · 0.25mm/px · 14 of 36 slices shown]
[im 1/36]
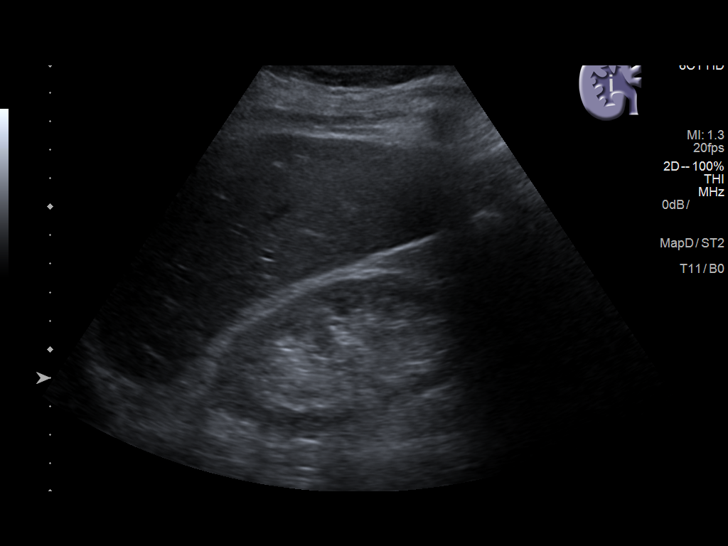
[im 3/36]
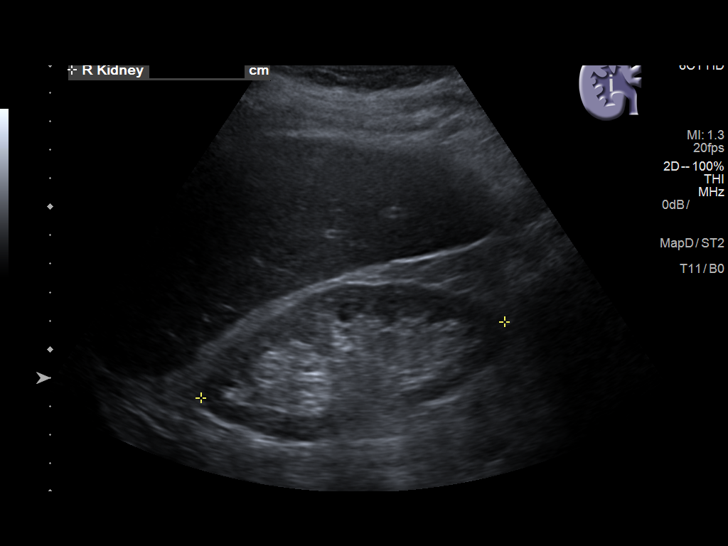
[im 6/36]
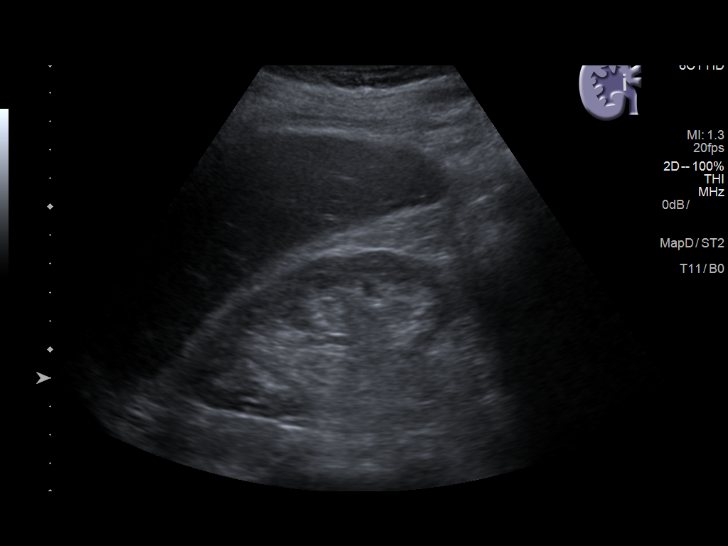
[im 9/36]
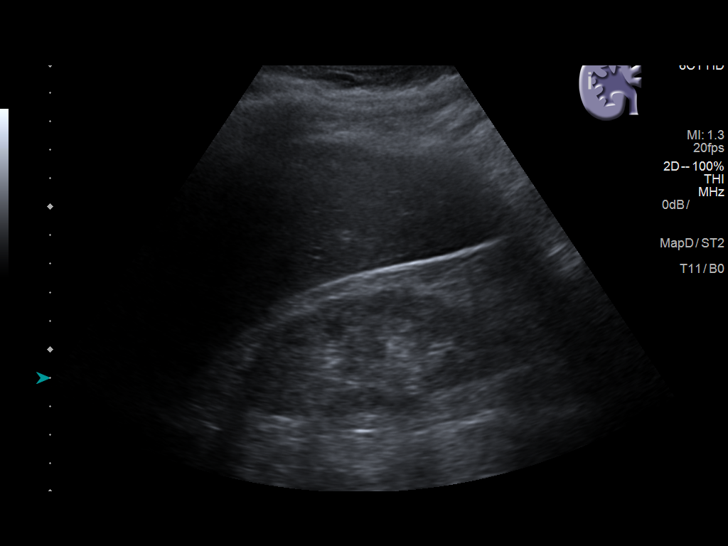
[im 12/36]
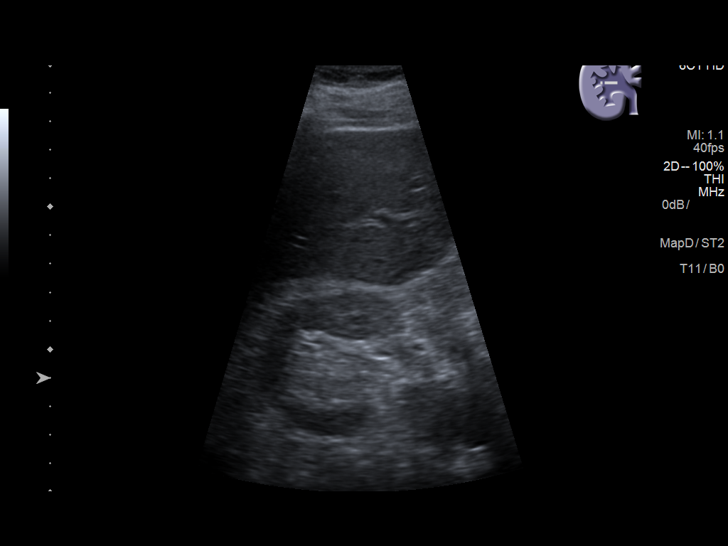
[im 14/36]
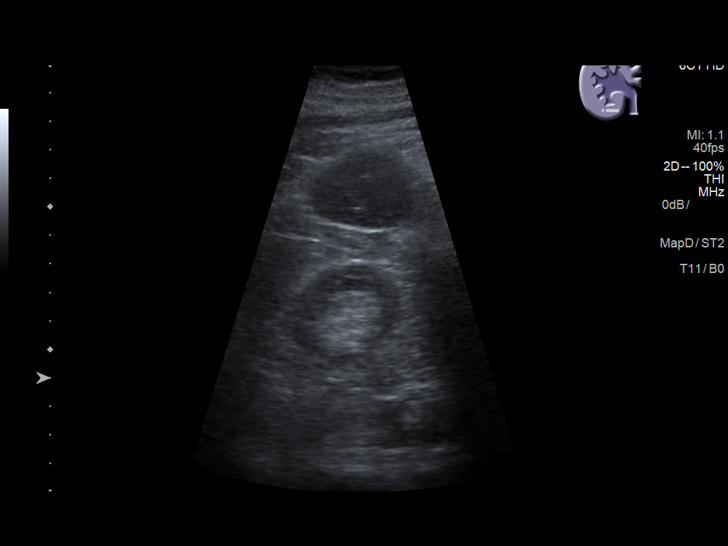
[im 17/36]
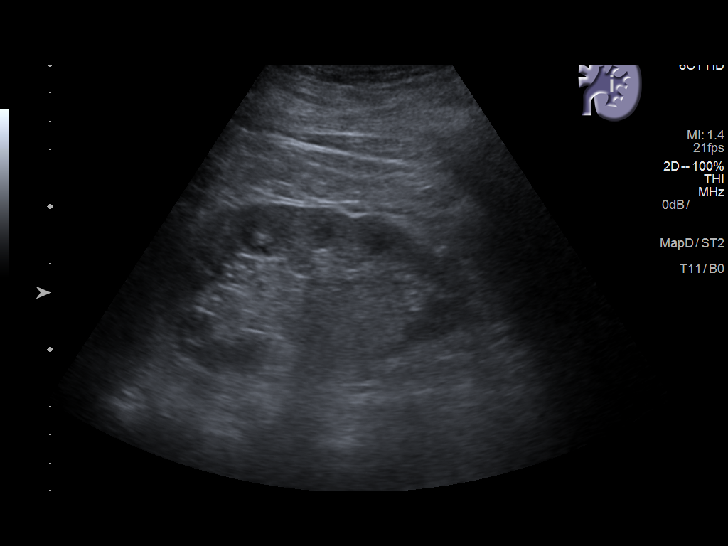
[im 19/36]
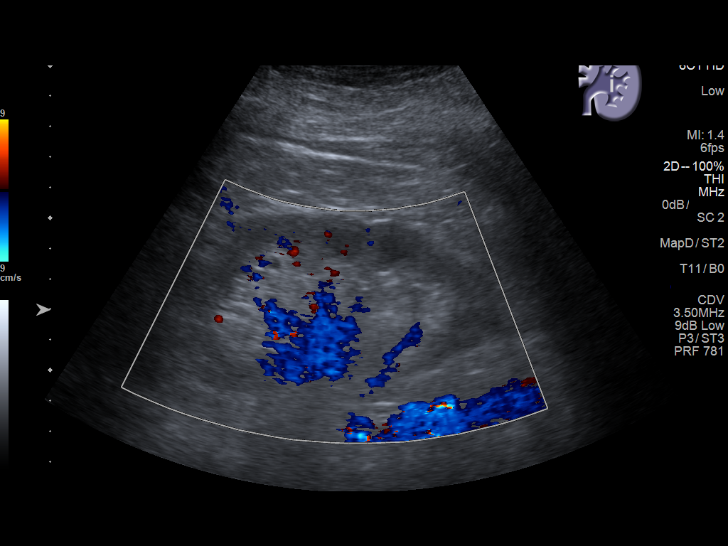
[im 22/36]
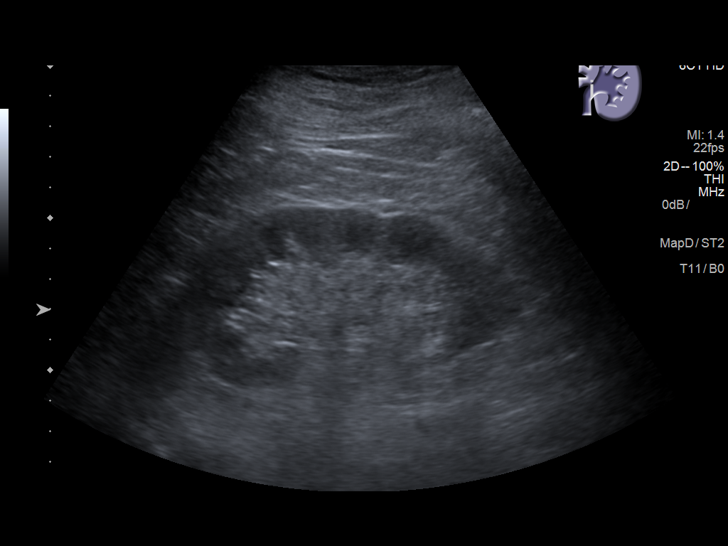
[im 24/36]
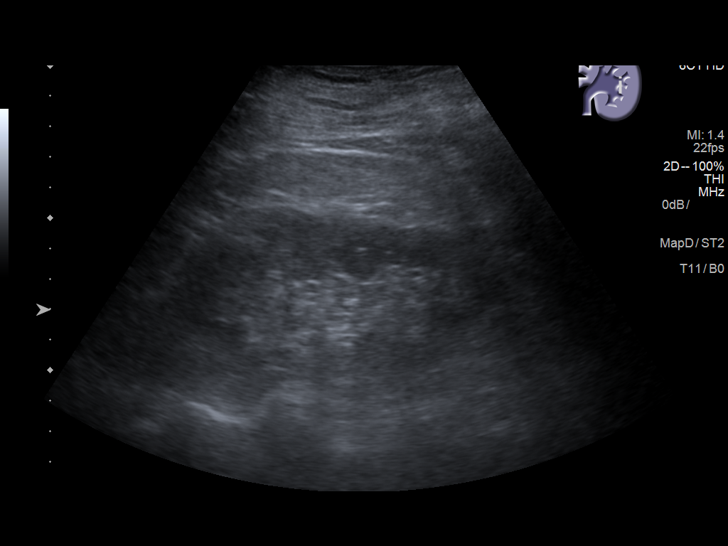
[im 27/36]
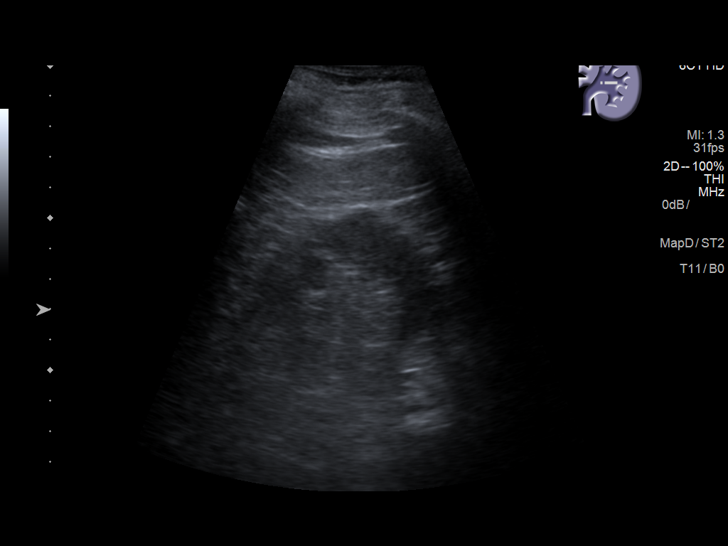
[im 30/36]
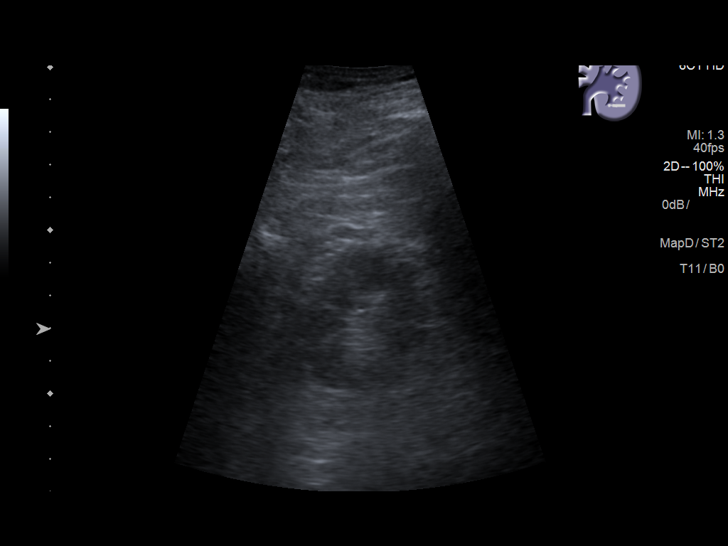
[im 33/36]
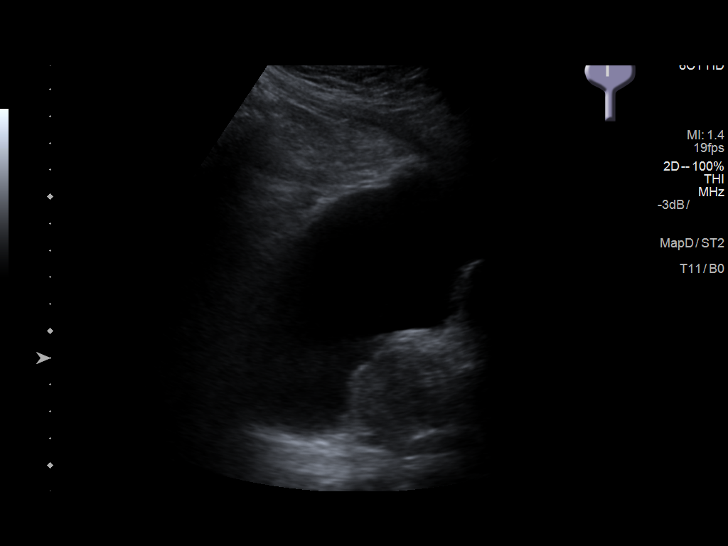
[im 36/36]
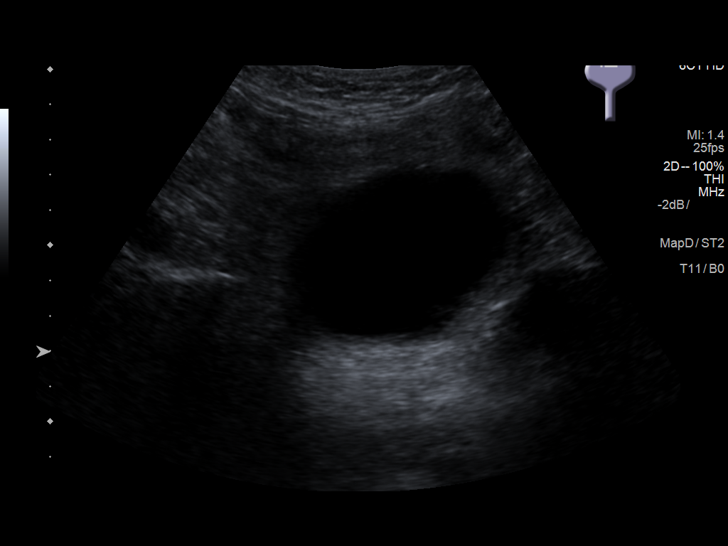

[14 of 25 positions shown; findings below may reference images not displayed]

FINDINGS: Right Kidney:

Length: 11.0 cm. Mild increase echogenicity. Renal cortical
irregularity consistent scarring P No mass or hydronephrosis
visualized.

Left Kidney:

Length: 11.2 cm. Mild increase echogenicity. Renal cortical
irregularity consistent scarring. No mass or hydronephrosis
visualized.

Bladder:

Appears normal for degree of bladder distention. Prostate is
prominent.
IMPRESSION: 1. Mild increased echogenicity both kidneys. Disc consistent chronic
medical renal disease. Renal cortical irregularity consistent with
scarring .

2. No acute abnormality.  No hydronephrosis.

3. Prostate prominence.  No bladder distention .

## 2018-04-29 ENCOUNTER — Ambulatory Visit (INDEPENDENT_AMBULATORY_CARE_PROVIDER_SITE_OTHER): Payer: PPO | Admitting: Ophthalmology

## 2018-06-21 IMAGING — DX DG CHEST 1V
1 series · 1 of 1 positions shown · non-contrast
Comparison: None.

CLINICAL DATA: Shortness of Breath

EXAM:
CHEST 1 VIEW

[chest ap]
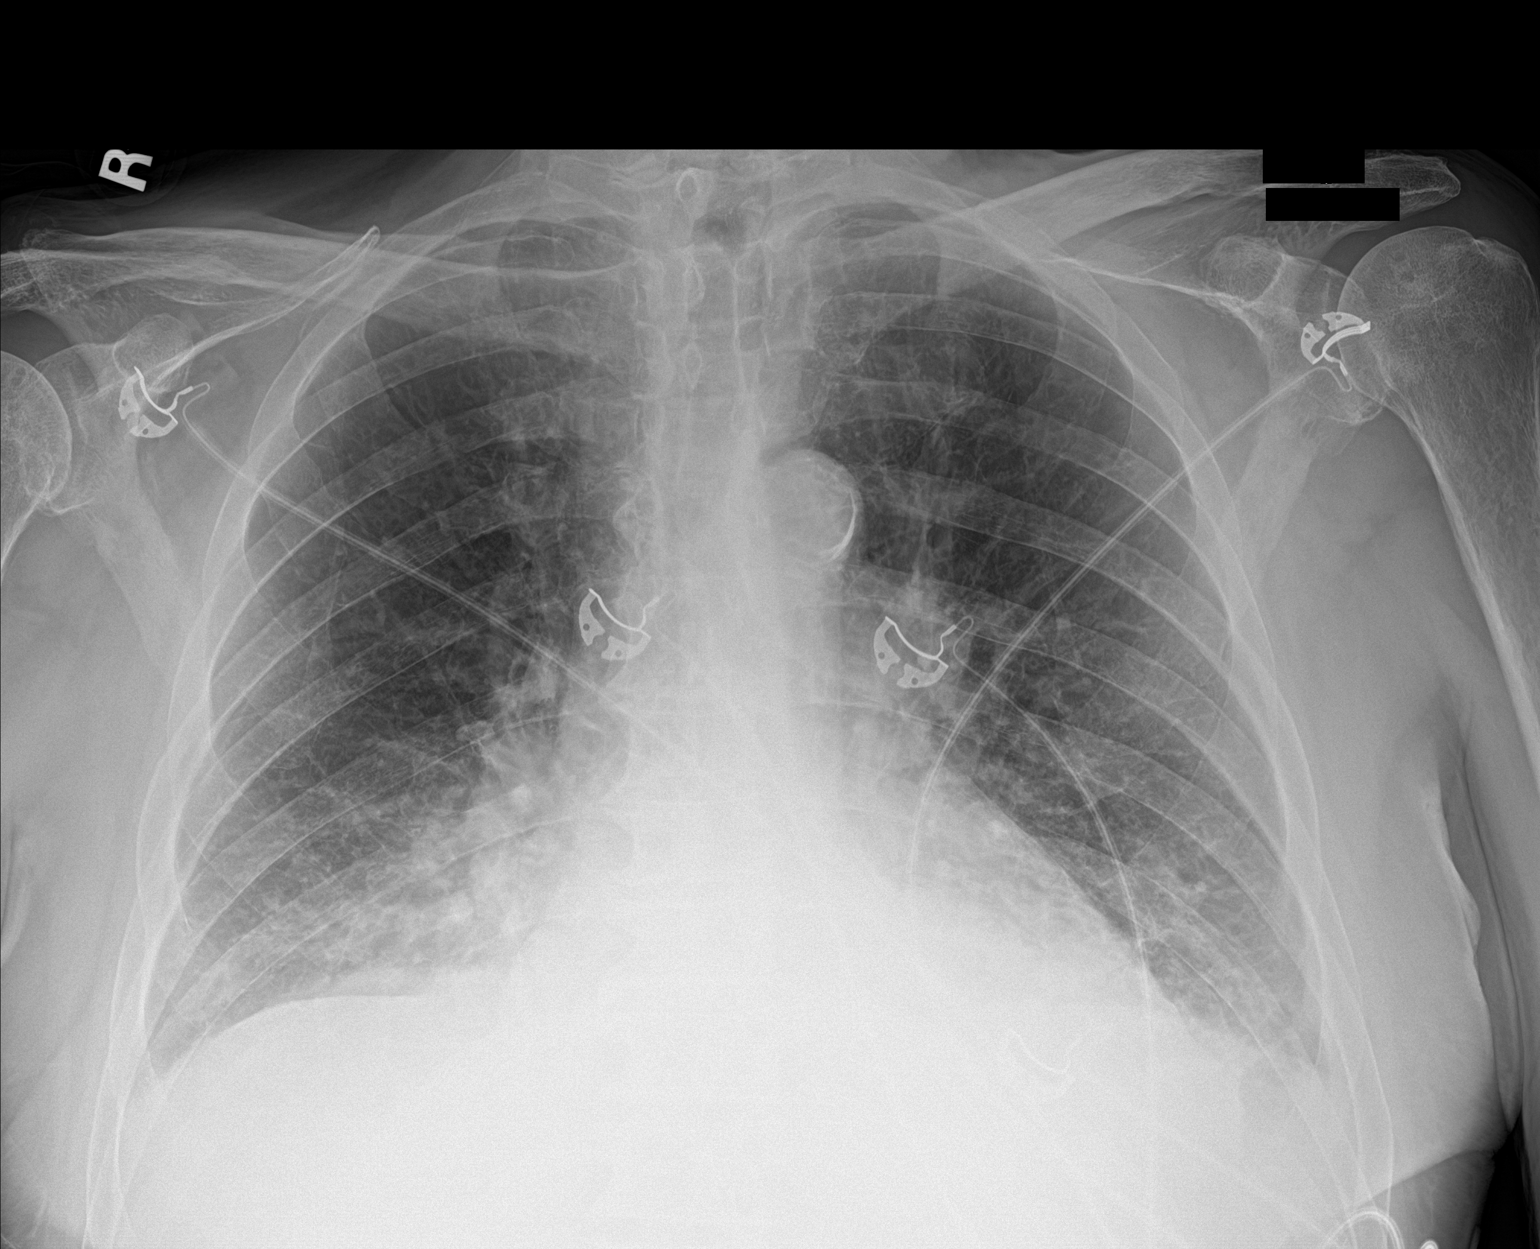

[1 of 1 positions shown; findings below may reference images not displayed]

FINDINGS: Bilateral perihilar and lower lobe airspace opacities likely
reflects edema. Small effusions suspected. Mild cardiomegaly.
Atherosclerotic change in the aortic arch. No acute bony
abnormality.
IMPRESSION: Cardiomegaly with bilateral perihilar and lower lobe opacities as
above with small effusions, likely CHF.

## 2018-07-03 IMAGING — CR DG CHEST 2V
2 series · 2 of 2 positions shown · non-contrast
Comparison: 10/16/2017

CLINICAL DATA: Shortness of breath and fluid retention for 1 week.

EXAM:
CHEST  2 VIEW

[chest pa]
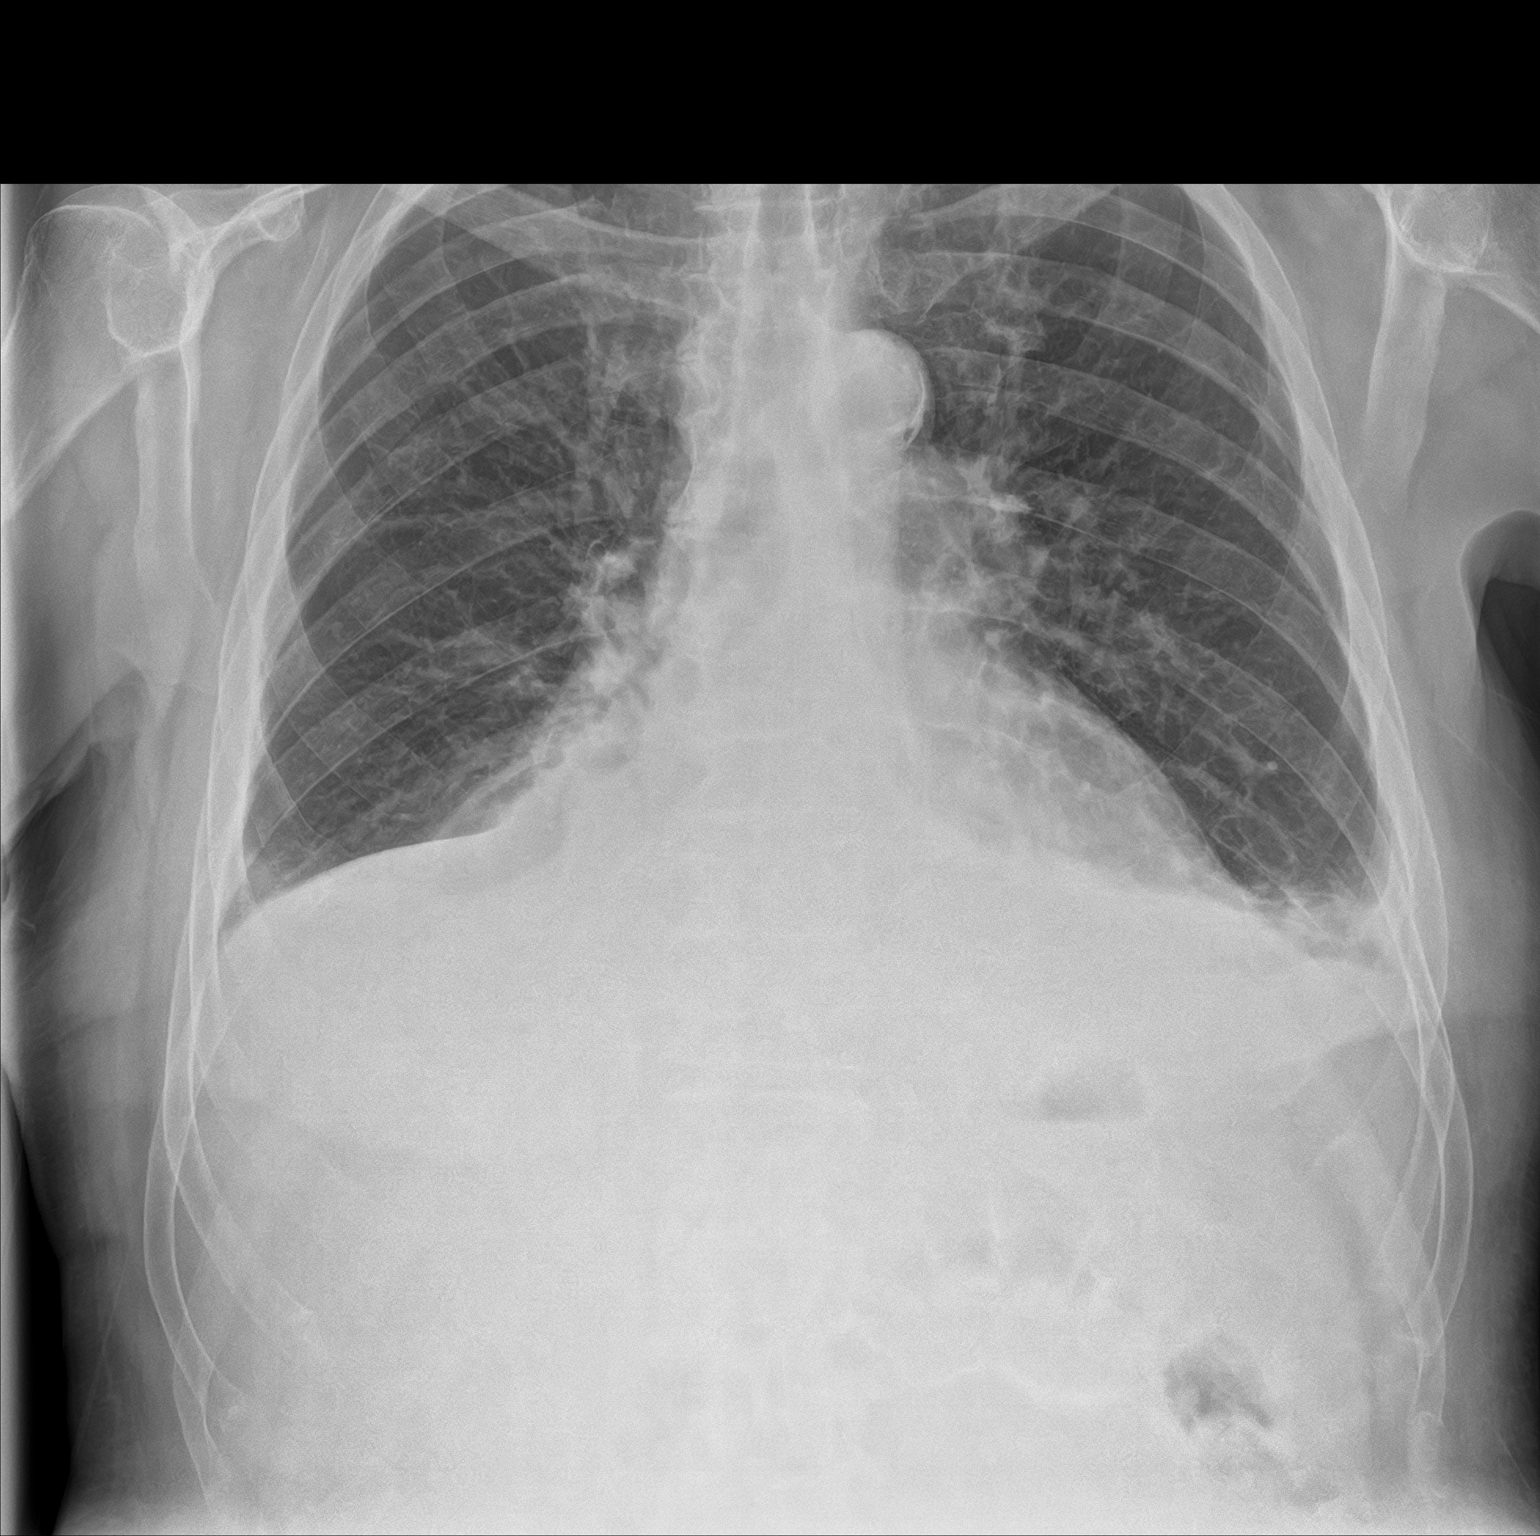

[chest lat]
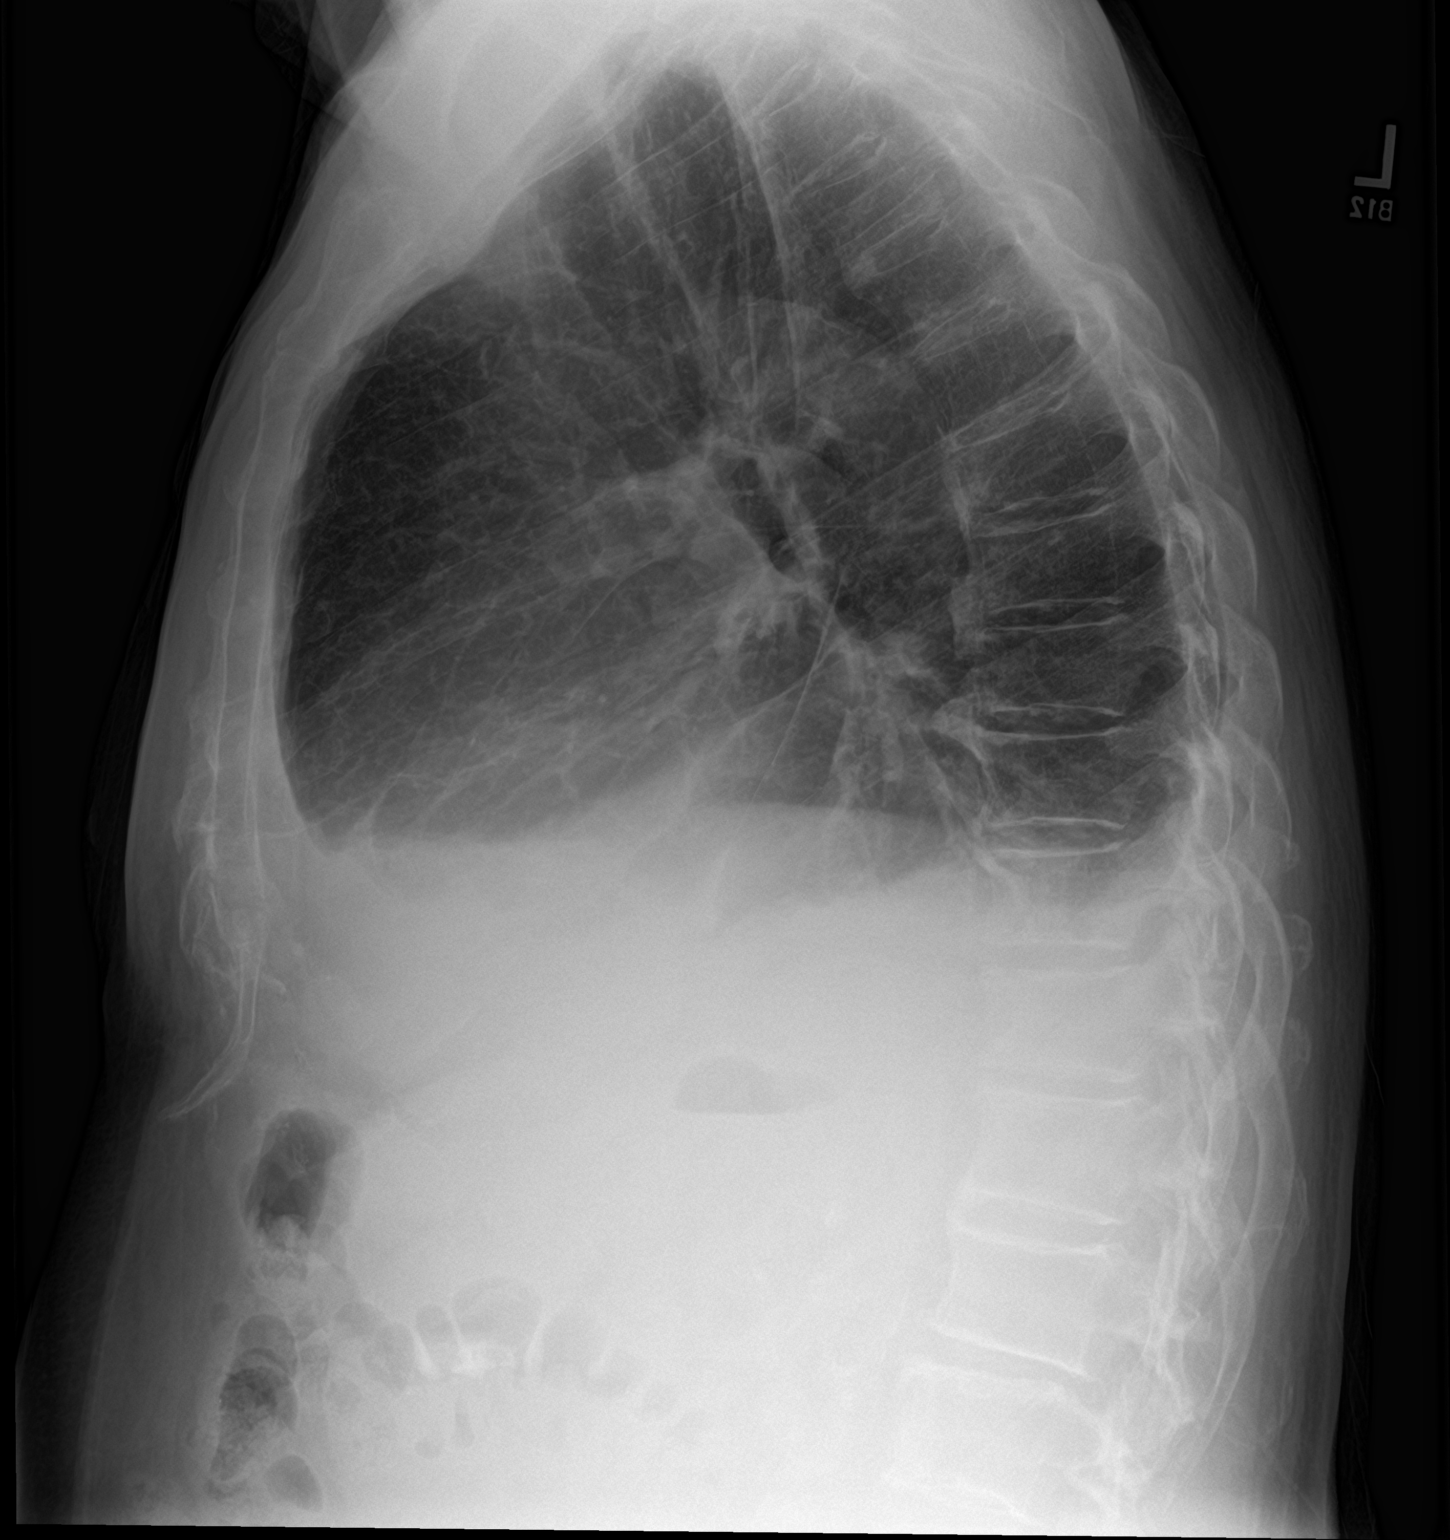

[2 of 2 positions shown; findings below may reference images not displayed]

FINDINGS: Normal heart size. Stable mediastinal contours. Small pleural
effusions with atelectatic type opacity greater at the left base. No
Kerley lines, air bronchogram, or pneumothorax.
IMPRESSION: Trace effusions and mild atelectasis.  No pulmonary edema.

## 2018-09-08 IMAGING — DX DG CHEST 1V PORT
1 series · 1 of 1 positions shown · non-contrast
Comparison: 10/26/2017

CLINICAL DATA: ETT placement

EXAM:
PORTABLE CHEST 1 VIEW

[chest ap]
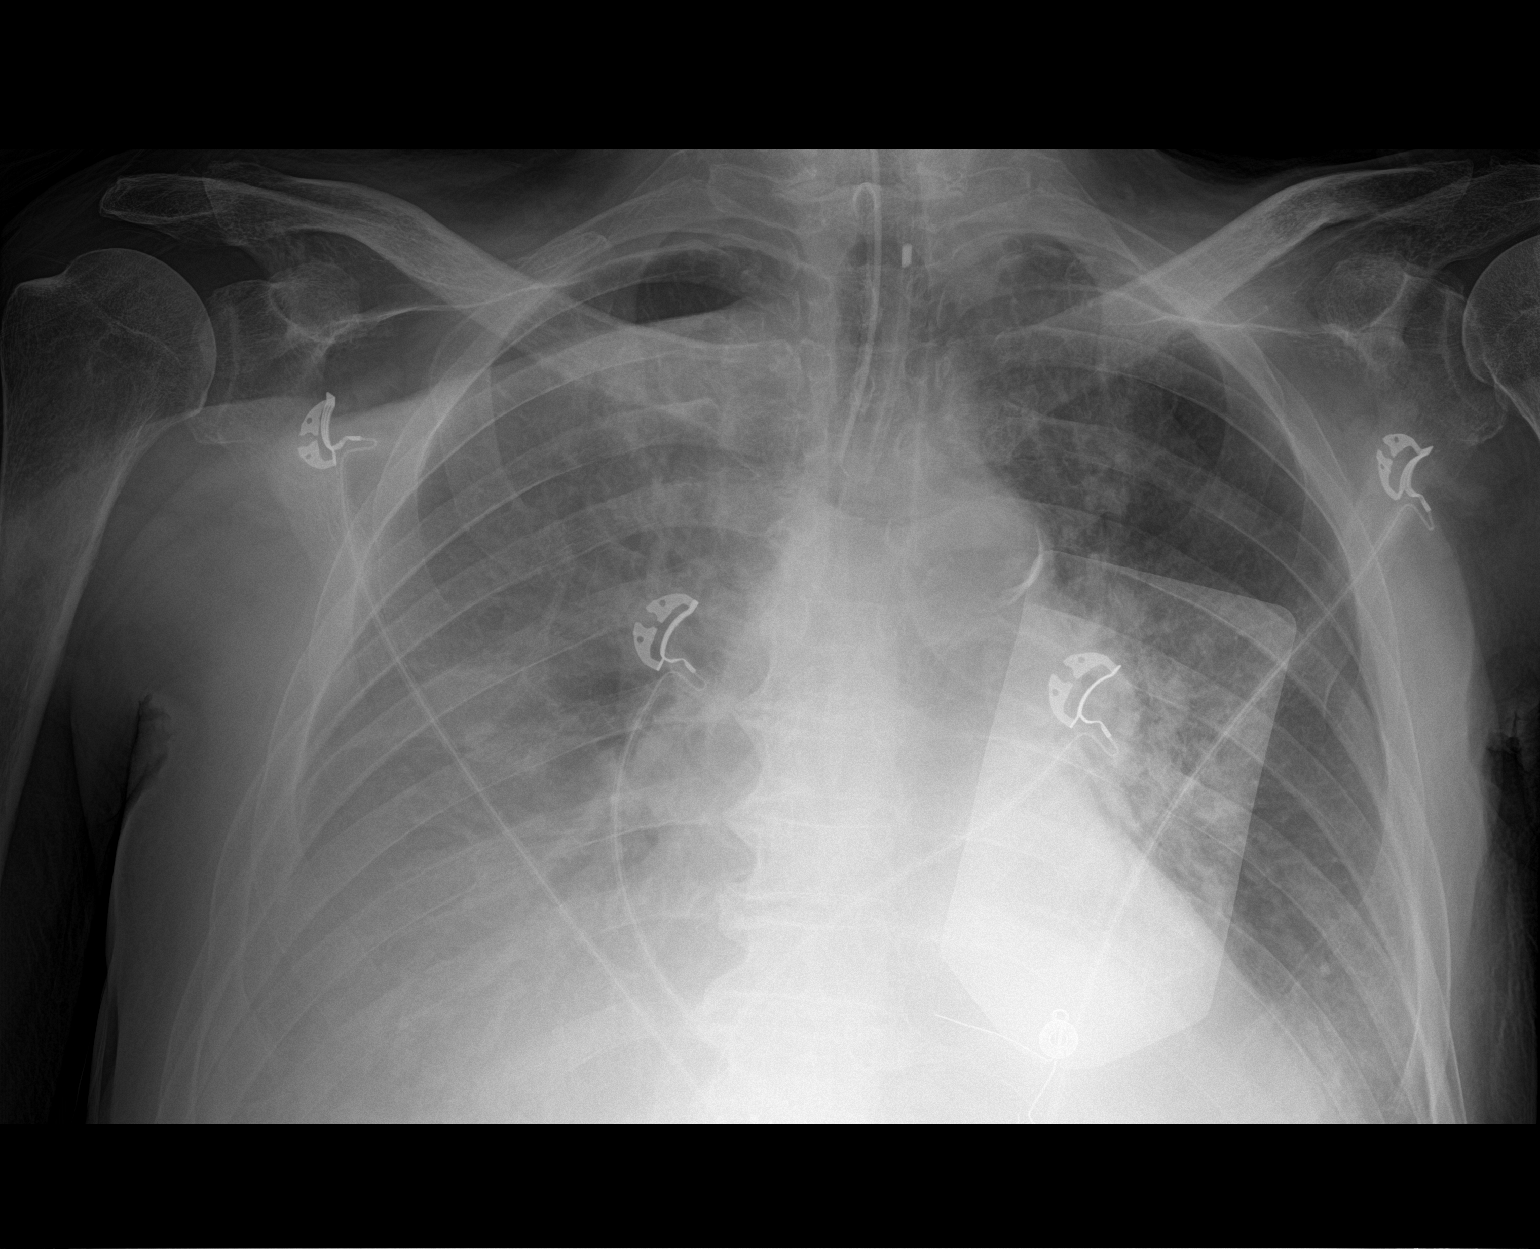

[1 of 1 positions shown; findings below may reference images not displayed]

FINDINGS: Endotracheal tube tip is about 3.5 cm superior to the carina.
Diffuse hazy opacity of the right thorax, suspect for layering
effusion. Cardiomegaly with vascular congestion and perihilar edema.
Aortic atherosclerosis. No pneumothorax.
IMPRESSION: 1. Endotracheal tube tip about 3.5 cm superior to carina
2. Cardiomegaly with vascular congestion and perihilar edema
3. Diffuse hazy opacity of the right thorax, suspect that this is
due to combination of layering effusion and underlying edema
4. Dense bibasilar consolidation, atelectasis versus pneumonia
